# Patient Record
Sex: Male | Born: 1985 | Race: Black or African American | Hispanic: No | Marital: Single | Smoking: Light tobacco smoker
Health system: Southern US, Community
[De-identification: ages and names within clinical notes are randomized; demographics above are authoritative.]

## PROBLEM LIST (undated history)

## (undated) DIAGNOSIS — C801 Malignant (primary) neoplasm, unspecified: Secondary | ICD-10-CM

## (undated) DIAGNOSIS — T7840XA Allergy, unspecified, initial encounter: Secondary | ICD-10-CM

## (undated) DIAGNOSIS — J45909 Unspecified asthma, uncomplicated: Secondary | ICD-10-CM

## (undated) HISTORY — DX: Allergy, unspecified, initial encounter: T78.40XA

---
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
## 2013-09-03 ENCOUNTER — Encounter (HOSPITAL_COMMUNITY): Payer: Self-pay | Admitting: Emergency Medicine

## 2013-09-03 ENCOUNTER — Emergency Department (INDEPENDENT_AMBULATORY_CARE_PROVIDER_SITE_OTHER)
Admission: EM | Admit: 2013-09-03 | Discharge: 2013-09-03 | Disposition: A | Discharge status: Home / Self Care | Payer: Self-pay | Source: Home / Self Care | Attending: Family Medicine | Admitting: Family Medicine

## 2013-09-03 DIAGNOSIS — J309 Allergic rhinitis, unspecified: Secondary | ICD-10-CM

## 2013-09-03 DIAGNOSIS — J4520 Mild intermittent asthma, uncomplicated: Secondary | ICD-10-CM

## 2013-09-03 DIAGNOSIS — J45909 Unspecified asthma, uncomplicated: Secondary | ICD-10-CM

## 2013-09-03 HISTORY — DX: Unspecified asthma, uncomplicated: J45.909

## 2013-09-03 MED ORDER — LORATADINE 10 MG PO TABS: 10.0000 mg | ORAL_TABLET | Freq: Every day | ORAL | Status: DC

## 2013-09-03 MED ORDER — ALBUTEROL SULFATE HFA 108 (90 BASE) MCG/ACT IN AERS: 1.0000 | INHALATION_SPRAY | Freq: Four times a day (QID) | RESPIRATORY_TRACT | Status: DC | PRN

## 2013-09-03 NOTE — ED Provider Notes (Signed)
Medical screening examination/treatment/procedure(s) were performed by resident physician or non-physician practitioner and as supervising physician I was immediately available for consultation/collaboration.   Pauline Good MD.   Billy Fischer, MD 09/03/13 2029

## 2013-09-03 NOTE — Discharge Instructions (Signed)
Allergic Rhinitis Allergic rhinitis is when the mucous membranes in the nose respond to allergens. Allergens are particles in the air that cause your body to have an allergic reaction. This causes you to release allergic antibodies. Through a chain of events, these eventually cause you to release histamine into the blood stream. Although meant to protect the body, it is this release of histamine that causes your discomfort, such as frequent sneezing, congestion, and an itchy, runny nose.  CAUSES  Seasonal allergic rhinitis (hay fever) is caused by pollen allergens that may come from grasses, trees, and weeds. Year-round allergic rhinitis (perennial allergic rhinitis) is caused by allergens such as house dust mites, pet dander, and mold spores.  SYMPTOMS   Nasal stuffiness (congestion).  Itchy, runny nose with sneezing and tearing of the eyes. DIAGNOSIS  Your health care provider can help you determine the allergen or allergens that trigger your symptoms. If you and your health care provider are unable to determine the allergen, skin or blood testing may be used. TREATMENT  Allergic rhinitis does not have a cure, but it can be controlled by:  Medicines and allergy shots (immunotherapy).  Avoiding the allergen. Hay fever may often be treated with antihistamines in pill or nasal spray forms. Antihistamines block the effects of histamine. There are over-the-counter medicines that may help with nasal congestion and swelling around the eyes. Check with your health care provider before taking or giving this medicine.  If avoiding the allergen or the medicine prescribed do not work, there are many new medicines your health care provider can prescribe. Stronger medicine may be used if initial measures are ineffective. Desensitizing injections can be used if medicine and avoidance does not work. Desensitization is when a patient is given ongoing shots until the body becomes less sensitive to the allergen.  Make sure you follow up with your health care provider if problems continue. HOME CARE INSTRUCTIONS It is not possible to completely avoid allergens, but you can reduce your symptoms by taking steps to limit your exposure to them. It helps to know exactly what you are allergic to so that you can avoid your specific triggers. SEEK MEDICAL CARE IF:   You have a fever.  You develop a cough that does not stop easily (persistent).  You have shortness of breath.  You start wheezing.  Symptoms interfere with normal daily activities. Document Released: 10/13/2000 Document Revised: 01/23/2013 Document Reviewed: 09/25/2012 [facility] Patient Information 2015 [place], [place]. This information is not intended to replace advice given to you by your health care provider. Make sure you discuss any questions you have with your health care provider.  Asthma Attack Prevention Although there is no way to prevent asthma from starting, you can take steps to control the disease and reduce its symptoms. Learn about your asthma and how to control it. Take an active role to control your asthma by working with your health care provider to create and follow an asthma action plan. An asthma action plan guides you in:  Taking your medicines properly.  Avoiding things that set off your asthma or make your asthma worse (asthma triggers).  Tracking your level of asthma control.  Responding to worsening asthma.  Seeking emergency care when needed. To track your asthma, keep records of your symptoms, check your peak flow number using a handheld device that shows how well air moves out of your lungs (peak flow meter), and get regular asthma checkups.  WHAT ARE SOME WAYS TO PREVENT AN ASTHMA ATTACK?  Take medicines as directed by your health care provider.  Keep track of your asthma symptoms and level of control.  With your health care provider, write a detailed plan for taking medicines and managing an asthma attack.  Then be sure to follow your action plan. Asthma is an ongoing condition that needs regular monitoring and treatment.  Identify and avoid asthma triggers. Many outdoor allergens and irritants (such as pollen, mold, cold air, and air pollution) can trigger asthma attacks. Find out what your asthma triggers are and take steps to avoid them.  Monitor your breathing. Learn to recognize warning signs of an attack, such as coughing, wheezing, or shortness of breath. Your lung function may decrease before you notice any signs or symptoms, so regularly measure and record your peak airflow with a home peak flow meter.  Identify and treat attacks early. If you act quickly, you are less likely to have a severe attack. You will also need less medicine to control your symptoms. When your peak flow measurements decrease and alert you to an upcoming attack, take your medicine as instructed and immediately stop any activity that may have triggered the attack. If your symptoms do not improve, get medical help.  Pay attention to increasing quick-relief inhaler use. If you find yourself relying on your quick-relief inhaler, your asthma is not under control. See your health care provider about adjusting your treatment. WHAT CAN MAKE MY SYMPTOMS WORSE? A number of common things can set off or make your asthma symptoms worse and cause temporary increased inflammation of your airways. Keep track of your asthma symptoms for several weeks, detailing all the environmental and emotional factors that are linked with your asthma. When you have an asthma attack, go back to your asthma diary to see which factor, or combination of factors, might have contributed to it. Once you know what these factors are, you can take steps to control many of them. If you have allergies and asthma, it is important to take asthma prevention steps at home. Minimizing contact with the substance to which you are allergic will help prevent an asthma attack.  Some triggers and ways to avoid these triggers are: Animal Dander:  Some people are allergic to the flakes of skin or dried saliva from animals with fur or feathers.   There is no such thing as a hypoallergenic dog or cat breed. All dogs or cats can cause allergies, even if they don't shed.  Keep these pets out of your home.  If you are not able to keep a pet outdoors, keep the pet out of your bedroom and other sleeping areas at all times, and keep the door closed.  Remove carpets and furniture covered with cloth from your home. If that is not possible, keep the pet away from fabric-covered furniture and carpets. Dust Mites: Many people with asthma are allergic to dust mites. Dust mites are tiny bugs that are found in every home in mattresses, pillows, carpets, fabric-covered furniture, bedcovers, clothes, stuffed toys, and other fabric-covered items.   Cover your mattress in a special dust-proof cover.  Cover your pillow in a special dust-proof cover, or wash the pillow each week in hot water. Water must be hotter than 130 F (54.4 C) to kill dust mites. Cold or warm water used with detergent and bleach can also be effective.  Wash the sheets and blankets on your bed each week in hot water.  Try not to sleep or lie on cloth-covered cushions.  Call ahead  when traveling and ask for a smoke-free hotel room. Bring your own bedding and pillows in case the hotel only supplies feather pillows and down comforters, which may contain dust mites and cause asthma symptoms.  Remove carpets from your bedroom and those laid on concrete, if you can.  Keep stuffed toys out of the bed, or wash the toys weekly in hot water or cooler water with detergent and bleach. Cockroaches: Many people with asthma are allergic to the droppings and remains of cockroaches.   Keep food and garbage in closed containers. Never leave food out.  Use poison baits, traps, powders, gels, or paste (for example, boric  acid).  If a spray is used to kill cockroaches, stay out of the room until the odor goes away. Indoor Mold:  Fix leaky faucets, pipes, or other sources of water that have mold around them.  Clean floors and moldy surfaces with a fungicide or diluted bleach.  Avoid using humidifiers, vaporizers, or swamp coolers. These can spread molds through the air. Pollen and Outdoor Mold:  When pollen or mold spore counts are high, try to keep your windows closed.  Stay indoors with windows closed from late morning to afternoon. Pollen and some mold spore counts are highest at that time.  Ask your health care provider whether you need to take anti-inflammatory medicine or increase your dose of the medicine before your allergy season starts. Other Irritants to Avoid:  Tobacco smoke is an irritant. If you smoke, ask your health care provider how you can quit. Ask family members to quit smoking, too. Do not allow smoking in your home or car.  If possible, do not use a wood-burning stove, kerosene heater, or fireplace. Minimize exposure to all sources of smoke, including incense, candles, fires, and fireworks.  Try to stay away from strong odors and sprays, such as perfume, talcum powder, hair spray, and paints.  Decrease humidity in your home and use an indoor air cleaning device. Reduce indoor humidity to below 60%. Dehumidifiers or central air conditioners can do this.  Decrease house dust exposure by changing furnace and air cooler filters frequently.  Try to have someone else vacuum for you once or twice a week. Stay out of rooms while they are being vacuumed and for a short while afterward.  If you vacuum, use a dust mask from a hardware store, a double-layered or microfilter vacuum cleaner bag, or a vacuum cleaner with a HEPA filter.  Sulfites in foods and beverages can be irritants. Do not drink beer or wine or eat dried fruit, processed potatoes, or shrimp if they cause asthma  symptoms.  Cold air can trigger an asthma attack. Cover your nose and mouth with a scarf on cold or windy days.  Several health conditions can make asthma more difficult to manage, including a runny nose, sinus infections, reflux disease, psychological stress, and sleep apnea. Work with your health care provider to manage these conditions.  Avoid close contact with people who have a respiratory infection such as a cold or the flu, since your asthma symptoms may get worse if you catch the infection. Wash your hands thoroughly after touching items that may have been handled by people with a respiratory infection.  Get a flu shot every year to protect against the flu virus, which often makes asthma worse for days or weeks. Also get a pneumonia shot if you have not previously had one. Unlike the flu shot, the pneumonia shot does not need to be given  yearly. Medicines:  Talk to your health care provider about whether it is safe for you to take aspirin or non-steroidal anti-inflammatory medicines (NSAIDs). In a small number of people with asthma, aspirin and NSAIDs can cause asthma attacks. These medicines must be avoided by people who have known aspirin-sensitive asthma. It is important that people with aspirin-sensitive asthma read labels of all over-the-counter medicines used to treat pain, colds, coughs, and fever.  Beta-blockers and ACE inhibitors are other medicines you should discuss with your health care provider. HOW CAN I FIND OUT WHAT I AM ALLERGIC TO? Ask your asthma health care provider about allergy skin testing or blood testing (the RAST test) to identify the allergens to which you are sensitive. If you are found to have allergies, the most important thing to do is to try to avoid exposure to any allergens that you are sensitive to as much as possible. Other treatments for allergies, such as medicines and allergy shots (immunotherapy) are available.  CAN I EXERCISE? Follow your health care  provider's advice regarding asthma treatment before exercising. It is important to maintain a regular exercise program, but vigorous exercise or exercise in cold, humid, or dry environments can cause asthma attacks, especially for those people who have exercise-induced asthma. Document Released: 01/06/2009 Document Revised: 01/23/2013 Document Reviewed: 07/26/2012 [facility] Patient Information 2015 [place], [place]. This information is not intended to replace advice given to you by your health care provider. Make sure you discuss any questions you have with your health care provider.  Asthma, Acute Bronchospasm Acute bronchospasm caused by asthma is also referred to as an asthma attack. Bronchospasm means your air passages become narrowed. The narrowing is caused by inflammation and tightening of the muscles in the air tubes (bronchi) in your lungs. This can make it hard to breathe or cause you to wheeze and cough. CAUSES Possible triggers are:  Animal dander from the skin, hair, or feathers of animals.  Dust mites contained in house dust.  Cockroaches.  Pollen from trees or grass.  Mold.  Cigarette or tobacco smoke.  Air pollutants such as dust, household cleaners, hair sprays, aerosol sprays, paint fumes, strong chemicals, or strong odors.  Cold air or weather changes. Cold air may trigger inflammation. Winds increase molds and pollens in the air.  Strong emotions such as crying or laughing hard.  Stress.  Certain medicines such as aspirin or beta-blockers.  Sulfites in foods and drinks, such as dried fruits and wine.  Infections or inflammatory conditions, such as a flu, cold, or inflammation of the nasal membranes (rhinitis).  Gastroesophageal reflux disease (GERD). GERD is a condition where stomach acid backs up into your esophagus.  Exercise or strenuous activity. SIGNS AND SYMPTOMS   Wheezing.  Excessive coughing, particularly at night.  Chest tightness.  Shortness  of breath. DIAGNOSIS  Your health care provider will ask you about your medical history and perform a physical exam. A chest X-ray or blood testing may be performed to look for other causes of your symptoms or other conditions that may have triggered your asthma attack. TREATMENT  Treatment is aimed at reducing inflammation and opening up the airways in your lungs. Most asthma attacks are treated with inhaled medicines. These include quick relief or rescue medicines (such as bronchodilators) and controller medicines (such as inhaled corticosteroids). These medicines are sometimes given through an inhaler or a nebulizer. Systemic steroid medicine taken by mouth or given through an IV tube also can be used to reduce the inflammation when  an attack is moderate or severe. Antibiotic medicines are only used if a bacterial infection is present.  HOME CARE INSTRUCTIONS   Rest.  Drink plenty of liquids. This helps the mucus to remain thin and be easily coughed up. Only use caffeine in moderation and do not use alcohol until you have recovered from your illness.  Do not smoke. Avoid being exposed to secondhand smoke.  You play a critical role in keeping yourself in good health. Avoid exposure to things that cause you to wheeze or to have breathing problems.  Keep your medicines up-to-date and available. Carefully follow your health care provider's treatment plan.  Take your medicine exactly as prescribed.  When pollen or pollution is bad, keep windows closed and use an air conditioner or go to places with air conditioning.  Asthma requires careful medical care. See your health care provider for a follow-up as advised. If you are more than [redacted] weeks pregnant and you were prescribed any new medicines, let your obstetrician know about the visit and how you are doing. Follow up with your health care provider as directed.  After you have recovered from your asthma attack, make an appointment with your  outpatient doctor to talk about ways to reduce the likelihood of future attacks. If you do not have a doctor who manages your asthma, make an appointment with a primary care doctor to discuss your asthma. SEEK IMMEDIATE MEDICAL CARE IF:   You are getting worse.  You have trouble breathing. If severe, call your local emergency services (911 in the [place]  You develop chest pain or discomfort.  You are vomiting.  You are not able to keep fluids down.  You are coughing up yellow, green, brown, or bloody sputum.  You have a fever and your symptoms suddenly get worse.  You have trouble swallowing. MAKE SURE YOU:   Understand these instructions.  Will watch your condition.  Will get help right away if you are not doing well or get worse. Document Released: 05/05/2006 Document Revised: 01/23/2013 Document Reviewed: 07/26/2012 [facility] Patient Information 2015 [place], [place]. This information is not intended to replace advice given to you by your health care provider. Make sure you discuss any questions you have with your health care provider.  Asthma Asthma is a recurring condition in which the airways tighten and narrow. Asthma can make it difficult to breathe. It can cause coughing, wheezing, and shortness of breath. Asthma episodes, also called asthma attacks, range from minor to life-threatening. Asthma cannot be cured, but medicines and lifestyle changes can help control it. CAUSES Asthma is believed to be caused by inherited (genetic) and environmental factors, but its exact cause is unknown. Asthma may be triggered by allergens, lung infections, or irritants in the air. Asthma triggers are different for each person. Common triggers include:   Animal dander.  Dust mites.  Cockroaches.  Pollen from trees or grass.  Mold.  Smoke.  Air pollutants such as dust, household cleaners, hair sprays, aerosol sprays, paint fumes, strong chemicals, or strong odors.  Cold air,  weather changes, and winds (which increase molds and pollens in the air).  Strong emotional expressions such as crying or laughing hard.  Stress.  Certain medicines (such as aspirin) or types of drugs (such as beta-blockers).  Sulfites in foods and drinks. Foods and drinks that may contain sulfites include dried fruit, potato chips, and sparkling grape juice.  Infections or inflammatory conditions such as the flu, a cold, or an inflammation of the  nasal membranes (rhinitis).  Gastroesophageal reflux disease (GERD).  Exercise or strenuous activity. SYMPTOMS Symptoms may occur immediately after asthma is triggered or many hours later. Symptoms include:  Wheezing.  Excessive nighttime or early morning coughing.  Frequent or severe coughing with a common cold.  Chest tightness.  Shortness of breath. DIAGNOSIS  The diagnosis of asthma is made by a review of your medical history and a physical exam. Tests may also be performed. These may include:  Lung function studies. These tests show how much air you breathe in and out.  Allergy tests.  Imaging tests such as X-rays. TREATMENT  Asthma cannot be cured, but it can usually be controlled. Treatment involves identifying and avoiding your asthma triggers. It also involves medicines. There are 2 classes of medicine used for asthma treatment:   Controller medicines. These prevent asthma symptoms from occurring. They are usually taken every day.  Reliever or rescue medicines. These quickly relieve asthma symptoms. They are used as needed and provide short-term relief. Your health care provider will help you create an asthma action plan. An asthma action plan is a written plan for managing and treating your asthma attacks. It includes a list of your asthma triggers and how they may be avoided. It also includes information on when medicines should be taken and when their dosage should be changed. An action plan may also involve the use of a  device called a peak flow meter. A peak flow meter measures how well the lungs are working. It helps you monitor your condition. HOME CARE INSTRUCTIONS   Take medicines only as directed by your health care provider. Speak with your health care provider if you have questions about how or when to take the medicines.  Use a peak flow meter as directed by your health care provider. Record and keep track of readings.  Understand and use the action plan to help minimize or stop an asthma attack without needing to seek medical care.  Control your home environment in the following ways to help prevent asthma attacks:  Do not smoke. Avoid being exposed to secondhand smoke.  Change your heating and air conditioning filter regularly.  Limit your use of fireplaces and wood stoves.  Get rid of pests (such as roaches and mice) and their droppings.  Throw away plants if you see mold on them.  Clean your floors and dust regularly. Use unscented cleaning products.  Try to have someone else vacuum for you regularly. Stay out of rooms while they are being vacuumed and for a short while afterward. If you vacuum, use a dust mask from a hardware store, a double-layered or microfilter vacuum cleaner bag, or a vacuum cleaner with a HEPA filter.  Replace carpet with wood, tile, or vinyl flooring. Carpet can trap dander and dust.  Use allergy-proof pillows, mattress covers, and box spring covers.  Wash bed sheets and blankets every week in hot water and dry them in a dryer.  Use blankets that are made of polyester or cotton.  Clean bathrooms and kitchens with bleach. If possible, have someone repaint the walls in these rooms with mold-resistant paint. Keep out of the rooms that are being cleaned and painted.  Wash hands frequently. SEEK MEDICAL CARE IF:   You have wheezing, shortness of breath, or a cough even if taking medicine to prevent attacks.  The colored mucus you cough up (sputum) is thicker  than usual.  Your sputum changes from clear or white to yellow, green, gray, or  bloody.  You have any problems that may be related to the medicines you are taking (such as a rash, itching, swelling, or trouble breathing).  You are using a reliever medicine more than 2-3 times per week.  Your peak flow is still at 50-79% of your personal best after following your action plan for 1 hour.  You have a fever. SEEK IMMEDIATE MEDICAL CARE IF:   You seem to be getting worse and are unresponsive to treatment during an asthma attack.  You are short of breath even at rest.  You get short of breath when doing very little physical activity.  You have difficulty eating, drinking, or talking due to asthma symptoms.  You develop chest pain.  You develop a fast heartbeat.  You have a bluish color to your lips or fingernails.  You are light-headed, dizzy, or faint.  Your peak flow is less than 50% of your personal best. MAKE SURE YOU:   Understand these instructions.  Will watch your condition.  Will get help right away if you are not doing well or get worse. Document Released: 01/18/2005 Document Revised: 06/04/2013 Document Reviewed: 08/17/2012 [facility] Patient Information 2015 [place], [place]. This information is not intended to replace advice given to you by your health care provider. Make sure you discuss any questions you have with your health care provider.

## 2013-09-03 NOTE — ED Provider Notes (Signed)
CSN: [id number]     Arrival date & time 09/03/13  1757 History   First MD Initiated Contact with Patient 09/03/13 1827     Chief Complaint  Patient presents with  . Asthma   (Consider location/radiation/quality/duration/timing/severity/associated sxs/prior Treatment) HPI Comments: Recently released from prison (on August 10, 2013) and is in transitional housing at [facility]. Quit smoking several months ago Long standing history of allergic rhinitis and asthma, Ran out of his albuterol inhaler and borrowed inhaler from another person today. States he is feeling better, but requests Rx for new albuterol MDI.   Patient is a 28 y.o. male presenting with asthma. The history is provided by the patient.  Asthma This is a recurrent problem. Episode onset: today while working at [employer].    Past Medical History  Diagnosis Date  . Asthma    History reviewed. No pertinent past surgical history. History reviewed. No pertinent family history. History  Substance Use Topics  . Smoking status: Never Smoker   . Smokeless tobacco: Not on file  . Alcohol Use: Not on file    Review of Systems  All other systems reviewed and are negative.   Allergies  Review of patient's allergies indicates no known allergies.  Home Medications   Prior to Admission medications   Medication Sig Start Date End Date Taking? Authorizing Provider  albuterol (PROVENTIL HFA;VENTOLIN HFA) 108 (90 BASE) MCG/ACT inhaler Inhale 1-2 puffs into the lungs every 6 (six) hours as needed for wheezing or shortness of breath. 09/03/13   Lahoma Rocker, PA  loratadine (CLARITIN) 10 MG tablet Take 1 tablet (10 mg total) by mouth daily. 09/03/13   Annett Gula Jahon Bart, PA   BP 119/72  Pulse 78  Temp(Src) 98 F (36.7 C) (Oral)  Resp 18  SpO2 97% Physical Exam  Nursing note and vitals reviewed. Constitutional: He is oriented to person, place, and time. He appears well-developed and well-nourished. No distress.   No hypoxia  HENT:  Head: Normocephalic and atraumatic.  Right Ear: External ear normal.  Left Ear: External ear normal.  Nose: Nose normal.  Mouth/Throat: Oropharynx is clear and moist.  Eyes: Conjunctivae are normal. No scleral icterus.  Cardiovascular: Normal rate, regular rhythm and normal heart sounds.   Pulmonary/Chest: Effort normal and breath sounds normal. No stridor. No respiratory distress. He has no wheezes. He exhibits no tenderness.  Musculoskeletal: Normal range of motion.  Neurological: He is alert and oriented to person, place, and time.  Skin: Skin is warm and dry. No rash noted. No erythema.  Psychiatric: He has a normal mood and affect. His behavior is normal.    ED Course  Procedures (including critical care time) Labs Review Labs Reviewed - No data to display  Imaging Review No results found.   MDM   1. Allergic rhinitis, unspecified allergic rhinitis type   2. Asthma, mild intermittent, uncomplicated    Albuterol MDI and Claritin as prescribed. Voices understanding that if symptoms become suddenly worse or severe, he is to report to his nearest ER for assistance.    [place], [place] 09/03/13 [phone]

## 2013-09-03 NOTE — ED Notes (Signed)
States he has a prior history of breathing problems that are worse when he is around dogs. 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 C/o he had to borrow albuterol MDI from another person because of his breathing problems

## 2013-09-14 ENCOUNTER — Emergency Department (INDEPENDENT_AMBULATORY_CARE_PROVIDER_SITE_OTHER)
Admission: EM | Admit: 2013-09-14 | Discharge: 2013-09-14 | Disposition: A | Discharge status: Nursing Home (Includes ICF) | Payer: Self-pay | Source: Home / Self Care | Attending: Family Medicine | Admitting: Family Medicine

## 2013-09-14 ENCOUNTER — Encounter (HOSPITAL_COMMUNITY): Payer: Self-pay | Admitting: Emergency Medicine

## 2013-09-14 ENCOUNTER — Emergency Department (HOSPITAL_COMMUNITY)
Admission: EM | Admit: 2013-09-14 | Discharge: 2013-09-14 | Disposition: A | Discharge status: Home / Self Care | Payer: Self-pay | Attending: Emergency Medicine | Admitting: Emergency Medicine

## 2013-09-14 ENCOUNTER — Emergency Department (HOSPITAL_COMMUNITY): Payer: Self-pay

## 2013-09-14 DIAGNOSIS — R509 Fever, unspecified: Secondary | ICD-10-CM

## 2013-09-14 DIAGNOSIS — K219 Gastro-esophageal reflux disease without esophagitis: Secondary | ICD-10-CM | POA: Insufficient documentation

## 2013-09-14 DIAGNOSIS — Z87891 Personal history of nicotine dependence: Secondary | ICD-10-CM | POA: Insufficient documentation

## 2013-09-14 DIAGNOSIS — R110 Nausea: Secondary | ICD-10-CM | POA: Insufficient documentation

## 2013-09-14 DIAGNOSIS — IMO0001 Reserved for inherently not codable concepts without codable children: Secondary | ICD-10-CM | POA: Insufficient documentation

## 2013-09-14 DIAGNOSIS — Z79899 Other long term (current) drug therapy: Secondary | ICD-10-CM | POA: Insufficient documentation

## 2013-09-14 DIAGNOSIS — R51 Headache: Secondary | ICD-10-CM | POA: Insufficient documentation

## 2013-09-14 DIAGNOSIS — R489 Unspecified symbolic dysfunctions: Secondary | ICD-10-CM | POA: Insufficient documentation

## 2013-09-14 DIAGNOSIS — Z9889 Other specified postprocedural states: Secondary | ICD-10-CM | POA: Insufficient documentation

## 2013-09-14 DIAGNOSIS — R1031 Right lower quadrant pain: Secondary | ICD-10-CM

## 2013-09-14 DIAGNOSIS — Z8509 Personal history of malignant neoplasm of other digestive organs: Secondary | ICD-10-CM | POA: Insufficient documentation

## 2013-09-14 DIAGNOSIS — R109 Unspecified abdominal pain: Secondary | ICD-10-CM | POA: Insufficient documentation

## 2013-09-14 DIAGNOSIS — Z880 Allergy status to penicillin: Secondary | ICD-10-CM | POA: Insufficient documentation

## 2013-09-14 DIAGNOSIS — J45909 Unspecified asthma, uncomplicated: Secondary | ICD-10-CM | POA: Insufficient documentation

## 2013-09-14 DIAGNOSIS — R630 Anorexia: Secondary | ICD-10-CM | POA: Insufficient documentation

## 2013-09-14 DIAGNOSIS — R197 Diarrhea, unspecified: Secondary | ICD-10-CM | POA: Insufficient documentation

## 2013-09-14 HISTORY — DX: Malignant (primary) neoplasm, unspecified: C80.1

## 2013-09-14 LAB — COMPREHENSIVE METABOLIC PANEL
ALBUMIN: 4 g/dL (ref 3.5–5.2)
ALT: 11 U/L (ref 0–53)
AST: 21 U/L (ref 0–37)
Alkaline Phosphatase: 126 U/L — ABNORMAL HIGH (ref 39–117)
Anion gap: 12 (ref 5–15)
BUN: 13 mg/dL (ref 6–23)
CO2: 28 meq/L (ref 19–32)
CREATININE: 1.45 mg/dL — AB (ref 0.50–1.35)
Calcium: 9.9 mg/dL (ref 8.4–10.5)
Chloride: 100 mEq/L (ref 96–112)
GFR calc Af Amer: 74 mL/min — ABNORMAL LOW (ref >90)
GFR calc non Af Amer: 64 mL/min — ABNORMAL LOW (ref >90)
Glucose, Bld: 89 mg/dL (ref 70–99)
Potassium: 5 mEq/L (ref 3.7–5.3)
SODIUM: 140 meq/L (ref 137–147)
TOTAL PROTEIN: 7.7 g/dL (ref 6.0–8.3)
Total Bilirubin: 0.4 mg/dL (ref 0.3–1.2)

## 2013-09-14 LAB — CBC WITH DIFFERENTIAL/PLATELET
BASOS ABS: 0 10*3/uL (ref 0.0–0.1)
BASOS PCT: 0 % (ref 0–1)
EOS ABS: 0.2 10*3/uL (ref 0.0–0.7)
EOS PCT: 1 % (ref 0–5)
HCT: 44.2 % (ref 39.0–52.0)
Hemoglobin: 14.3 g/dL (ref 13.0–17.0)
LYMPHS PCT: 15 % (ref 12–46)
Lymphs Abs: 1.7 10*3/uL (ref 0.7–4.0)
MCH: 28.1 pg (ref 26.0–34.0)
MCHC: 32.4 g/dL (ref 30.0–36.0)
MCV: 87 fL (ref 78.0–100.0)
Monocytes Absolute: 1.1 10*3/uL — ABNORMAL HIGH (ref 0.1–1.0)
Monocytes Relative: 10 % (ref 3–12)
Neutro Abs: 8.1 10*3/uL — ABNORMAL HIGH (ref 1.7–7.7)
Neutrophils Relative %: 74 % (ref 43–77)
Platelets: 317 10*3/uL (ref 150–400)
RBC: 5.08 MIL/uL (ref 4.22–5.81)
RDW: 12.2 % (ref 11.5–15.5)
WBC: 11.1 10*3/uL — ABNORMAL HIGH (ref 4.0–10.5)

## 2013-09-14 LAB — URINALYSIS, ROUTINE W REFLEX MICROSCOPIC
Bilirubin Urine: NEGATIVE
GLUCOSE, UA: NEGATIVE mg/dL
Ketones, ur: 15 mg/dL — AB
LEUKOCYTES UA: NEGATIVE
Nitrite: NEGATIVE
PH: 6 (ref 5.0–8.0)
Protein, ur: NEGATIVE mg/dL
SPECIFIC GRAVITY, URINE: 1.015 (ref 1.005–1.030)
Urobilinogen, UA: 0.2 mg/dL (ref 0.0–1.0)

## 2013-09-14 LAB — URINE MICROSCOPIC-ADD ON

## 2013-09-14 LAB — LIPASE, BLOOD: Lipase: 17 U/L (ref 11–59)

## 2013-09-14 MED ORDER — IOHEXOL 300 MG/ML  SOLN
25.0000 mL | Freq: Once | INTRAMUSCULAR | Status: AC | PRN
  Administered 2013-09-14: 25 mL via ORAL

## 2013-09-14 MED ORDER — ONDANSETRON HCL 4 MG/2ML IJ SOLN
4.0000 mg | Freq: Once | INTRAMUSCULAR | Status: AC
  Administered 2013-09-14: 4 mg via INTRAVENOUS
  Filled 2013-09-14: qty 2

## 2013-09-14 MED ORDER — SODIUM CHLORIDE 0.9 % IV BOLUS (SEPSIS)
1000.0000 mL | Freq: Once | INTRAVENOUS | Status: AC
  Administered 2013-09-14: 1000 mL via INTRAVENOUS

## 2013-09-14 MED ORDER — IOHEXOL 300 MG/ML  SOLN
100.0000 mL | Freq: Once | INTRAMUSCULAR | Status: AC | PRN
  Administered 2013-09-14: 100 mL via INTRAVENOUS

## 2013-09-14 MED ORDER — ONDANSETRON 4 MG PO TBDP: 4.0000 mg | ORAL_TABLET | Freq: Three times a day (TID) | ORAL | Status: DC | PRN

## 2013-09-14 NOTE — ED Notes (Signed)
Back from CT, no changes, alert, interactive, calm, NAD, using urinal.

## 2013-09-14 NOTE — Discharge Instructions (Signed)

## 2013-09-14 NOTE — ED Provider Notes (Signed)
John Allen is a 28 y.o. male who presents to Urgent Care today for abdominal pain. Patient notes a two-day history of abdominal pain diarrhea and fever. He notes some nausea and heaving. He denies any chest pains or palpitations. He works at the [employer]. Additionally he notes hand and feet pain. He notes some bright red blood when he wipes. He denies any urinary symptoms   Past Medical History  Diagnosis Date  . Asthma    History  Substance Use Topics  . Smoking status: Never Smoker   . Smokeless tobacco: Not on file  . Alcohol Use: Not on file   ROS as above Medications: No current facility-administered medications for this encounter.   Current Outpatient Prescriptions  Medication Sig Dispense Refill  . albuterol (PROVENTIL HFA;VENTOLIN HFA) 108 (90 BASE) MCG/ACT inhaler Inhale 1-2 puffs into the lungs every 6 (six) hours as needed for wheezing or shortness of breath.  1 Inhaler  1  . loratadine (CLARITIN) 10 MG tablet Take 1 tablet (10 mg total) by mouth daily.  30 tablet  2    Exam:  BP 123/81  Pulse 94  Temp(Src) 100.9 F (38.3 C) (Oral)  Resp 14  SpO2 98% Gen: Well NAD HEENT: EOMI,  MMM Lungs: Normal work of breathing. CTABL Heart: RRR no MRG Abd: NABS, . Nondistended, tender palpation right lower corner with mild rebounding and guarding Exts: Brisk capillary refill, warm and well perfused.   No results found for this or any previous visit (from the past 24 hour(s)). No results found.  Assessment and Plan: 28 y.o. male with right lower corner and abdominal pain associated with fever and possible bloody diarrhea. Concerning for appendicitis or colitis. Plan to transfer to the emergency department via shuttle for further evaluation and management.  Discussed warning signs or symptoms. Please see discharge instructions. Patient expresses understanding.   This note was created using Systems analyst. Any transcription errors are unintended.     Gregor Hams, MD 09/14/13 [phone]

## 2013-09-14 NOTE — ED Notes (Signed)
Pt to CT

## 2013-09-14 NOTE — ED Notes (Signed)
Pt presents with RLQ pain, fever, all over body aches, diarrhea x2 days. Pt also report pain to BUE and BLE x2 month. Pt is a resident at [employer] and works at the [employer].

## 2013-09-14 NOTE — ED Notes (Signed)
Brought pt back to room via wheelchair; pt undressed, in gown, on continuous pulse oximetry and blood pressure cuff; Hassan Rowan, RN aware of pt

## 2013-09-14 NOTE — ED Provider Notes (Signed)
CSN: [id number]     Arrival date & time 09/14/13  1735 History   First MD Initiated Contact with Patient 09/14/13 1849     Chief Complaint  Patient presents with  . Abdominal Pain  . Diarrhea  . Fever  . Headache     (Consider location/radiation/quality/duration/timing/severity/associated sxs/prior Treatment) The history is provided by the patient.   patient has had some nausea and diarrhea for 2 days. He states he is abdominal pain. Is worse on the right side. CTs have fevers. He states his hands and feet also hurt. No chest pain. He states he has myalgias. The pain is dull and somewhat constant. It's on his right side.  Past Medical History  Diagnosis Date  . Asthma   . Cancer     abd    Past Surgical History  Procedure Laterality Date  . Abdominal surgery      remove tumor  . Cystectomy      Right Hip   History reviewed. No pertinent family history. History  Substance Use Topics  . Smoking status: Former Research scientist (life sciences)  . Smokeless tobacco: Not on file  . Alcohol Use: No    Review of Systems  Constitutional: Positive for appetite change and fatigue. Negative for activity change.  Eyes: Negative for pain.  Respiratory: Negative for chest tightness and shortness of breath.   Cardiovascular: Negative for chest pain and leg swelling.  Gastrointestinal: Positive for nausea, abdominal pain and diarrhea. Negative for vomiting.  Genitourinary: Negative for flank pain.  Musculoskeletal: Positive for myalgias. Negative for back pain and neck stiffness.  Skin: Negative for rash.  Neurological: Negative for weakness, numbness and headaches.  Psychiatric/Behavioral: Negative for behavioral problems.      Allergies  Penicillins and Strawberry  Home Medications   Prior to Admission medications   Medication Sig Start Date End Date Taking? Authorizing Provider  albuterol (PROVENTIL HFA;VENTOLIN HFA) 108 (90 BASE) MCG/ACT inhaler Inhale 1-2 puffs into the lungs every 6 (six) hours  as needed for wheezing or shortness of breath. 09/03/13  Yes Audelia Hives Presson, PA  loratadine (CLARITIN) 10 MG tablet Take 1 tablet (10 mg total) by mouth daily. 09/03/13  Yes Audelia Hives Presson, PA  ondansetron (ZOFRAN ODT) 4 MG disintegrating tablet Take 1 tablet (4 mg total) by mouth every 8 (eight) hours as needed for nausea or vomiting. 09/14/13   Sherian Maroon, MD   BP 122/73  Pulse 79  Temp(Src) 99.1 F (37.3 C) (Oral)  Resp 18  SpO2 98% Physical Exam  Nursing note and vitals reviewed. Constitutional: He is oriented to person, place, and time. He appears well-developed and well-nourished.  HENT:  Head: Normocephalic and atraumatic.  Eyes: EOM are normal. Pupils are equal, round, and reactive to light.  Neck: Normal range of motion. Neck supple.  Cardiovascular: Normal rate, regular rhythm and normal heart sounds.   No murmur heard. Pulmonary/Chest: Effort normal and breath sounds normal.  Abdominal: Soft. Bowel sounds are normal. He exhibits no distension and no mass. There is tenderness. There is no rebound and no guarding.  Right mid to lower abdominal tenderness. No rebound or guarding. No hernias palpated  Musculoskeletal: Normal range of motion. He exhibits no edema.  Neurological: He is alert and oriented to person, place, and time. No cranial nerve deficit.  Skin: Skin is warm and dry.  Psychiatric: He has a normal mood and affect.    ED Course  Procedures (including critical care time) Labs Review Labs Reviewed  CBC WITH DIFFERENTIAL - Abnormal; Notable for the following:    WBC 11.1 (*)    Neutro Abs 8.1 (*)    Monocytes Absolute 1.1 (*)    All other components within normal limits  COMPREHENSIVE METABOLIC PANEL - Abnormal; Notable for the following:    Creatinine, Ser 1.45 (*)    Alkaline Phosphatase 126 (*)    GFR calc non Af Amer 64 (*)    GFR calc Af Amer 74 (*)    All other components within normal limits  URINALYSIS, ROUTINE W REFLEX MICROSCOPIC -  Abnormal; Notable for the following:    Hgb urine dipstick MODERATE (*)    Ketones, ur 15 (*)    All other components within normal limits  LIPASE, BLOOD  URINE MICROSCOPIC-ADD ON    Imaging Review Ct Abdomen Pelvis W Contrast  09/14/2013   CLINICAL DATA:  Right lower quadrant abdominal pain and nausea  EXAM: CT ABDOMEN AND PELVIS WITH CONTRAST  TECHNIQUE: Multidetector CT imaging of the abdomen and pelvis was performed using the standard protocol following bolus administration of intravenous contrast.  CONTRAST:  174mL OMNIPAQUE IOHEXOL 300 MG/ML  SOLN  COMPARISON:  None.  FINDINGS: Lung bases are clear.  No pericardial fluid.  No focal hepatic lesion. The gallbladder, pancreas, spleen, adrenal glands, and kidneys are normal. There is a low-density lesion in the right kidney measuring 7 mm. This likely represents a small benign cyst but is too small to completely characterize.  The stomach, small bowel, cecum, and appendix are normal. Terminal ileum is normal. The colon and rectosigmoid colon are normal.  Abdominal aorta normal caliber. No retroperitoneal periportal lymphadenopathy.  There is small amount free fluid in the right lower pelvis. The prostate and seminal vesicles appear normal. Normal bladder.  No pelvic lymphadenopathy. No inguinal hernia. No aggressive osseous lesion.  IMPRESSION: 1. Normal appendix. 2. Small amount free fluid in the right pelvis of unclear etiology. 3. No ureterolithiasis or obstructive uropathy. 4. Small hypodense lesions in right kidney is likely a benign cyst. 5. Gallbladder appears normal.   Electronically Signed   By: Suzy Bouchard M.D.   On: 09/14/2013 20:32     EKG Interpretation None      MDM   Final diagnoses:  Diarrhea    Patient with abdominal pain. Likely infectious diarrhea. Likely cause of free fluid in pelvis. Lab work overall reassuring. Will discharge    Jasper Riling. Alvino Chapel, [place] 09/15/13 [phone]

## 2013-09-14 NOTE — ED Notes (Signed)
C/o abd cramping (dizziness and nausea resolved; denies: HA, sob, nausea, dizziness), alert, NAD, calm, interactive, no dyspnea noted.

## 2013-09-14 NOTE — ED Notes (Signed)
Pt   Reports       He  Has  Body  Aches   And  Fever           For several  Days  He  Reports   he  Works  At the  [employer]  And is  A  Resident        Of the  [employer]        And he  Reports    abd  Pain  As  Well

## 2013-09-14 NOTE — ED Notes (Signed)
CT notified, pt ready.

## 2013-09-14 NOTE — ED Notes (Signed)
Pt up to br

## 2013-10-12 ENCOUNTER — Encounter (HOSPITAL_COMMUNITY): Payer: Self-pay | Admitting: Emergency Medicine

## 2013-10-12 ENCOUNTER — Emergency Department (HOSPITAL_COMMUNITY)
Admission: EM | Admit: 2013-10-12 | Discharge: 2013-10-12 | Disposition: A | Discharge status: Home / Self Care | Payer: Self-pay | Attending: Emergency Medicine | Admitting: Emergency Medicine

## 2013-10-12 DIAGNOSIS — Z880 Allergy status to penicillin: Secondary | ICD-10-CM | POA: Insufficient documentation

## 2013-10-12 DIAGNOSIS — M79609 Pain in unspecified limb: Secondary | ICD-10-CM | POA: Insufficient documentation

## 2013-10-12 DIAGNOSIS — Z79899 Other long term (current) drug therapy: Secondary | ICD-10-CM | POA: Insufficient documentation

## 2013-10-12 DIAGNOSIS — R202 Paresthesia of skin: Secondary | ICD-10-CM

## 2013-10-12 DIAGNOSIS — J45909 Unspecified asthma, uncomplicated: Secondary | ICD-10-CM | POA: Insufficient documentation

## 2013-10-12 DIAGNOSIS — Z87891 Personal history of nicotine dependence: Secondary | ICD-10-CM | POA: Insufficient documentation

## 2013-10-12 DIAGNOSIS — Z8589 Personal history of malignant neoplasm of other organs and systems: Secondary | ICD-10-CM | POA: Insufficient documentation

## 2013-10-12 DIAGNOSIS — R209 Unspecified disturbances of skin sensation: Secondary | ICD-10-CM | POA: Insufficient documentation

## 2013-10-12 LAB — URINALYSIS, ROUTINE W REFLEX MICROSCOPIC
BILIRUBIN URINE: NEGATIVE
Glucose, UA: NEGATIVE mg/dL
HGB URINE DIPSTICK: NEGATIVE
Ketones, ur: NEGATIVE mg/dL
Leukocytes, UA: NEGATIVE
Nitrite: NEGATIVE
Protein, ur: NEGATIVE mg/dL
Specific Gravity, Urine: 1.019 (ref 1.005–1.030)
UROBILINOGEN UA: 1 mg/dL (ref 0.0–1.0)
pH: 7 (ref 5.0–8.0)

## 2013-10-12 LAB — I-STAT CHEM 8, ED
BUN: 10 mg/dL (ref 6–23)
CALCIUM ION: 1.23 mmol/L (ref 1.12–1.23)
CHLORIDE: 103 meq/L (ref 96–112)
Creatinine, Ser: 1.3 mg/dL (ref 0.50–1.35)
GLUCOSE: 92 mg/dL (ref 70–99)
HCT: 41 % (ref 39.0–52.0)
Hemoglobin: 13.9 g/dL (ref 13.0–17.0)
Potassium: 3.9 mEq/L (ref 3.7–5.3)
Sodium: 140 mEq/L (ref 137–147)
TCO2: 28 mmol/L (ref 0–100)

## 2013-10-12 NOTE — ED Notes (Signed)
Pt c/o numbness in both his hands and his feet for 5 months..  He also has allergies

## 2013-10-12 NOTE — ED Provider Notes (Signed)
CSN: [id number]     Arrival date & time 10/12/13  1821 History   First MD Initiated Contact with Patient 10/12/13 2217     Chief Complaint  Patient presents with  . Hand Problem      HPI Patient reports for past 5 months he will wake up in the morning and have swelling in both hands with feeling of numbness and difficult "making a fist" due to the swelling. This will improve throughout the day.  No focal weakness.  No swelling above the wrist.  He also reports pain on plantar surface of his feet for several months, improves throughout the day.  No new neck or back pain.  No leg weakness.     Past Medical History  Diagnosis Date  . Asthma   . Cancer     abd    Past Surgical History  Procedure Laterality Date  . Abdominal surgery      remove tumor  . Cystectomy      Right Hip   No family history on file. History  Substance Use Topics  . Smoking status: Former Research scientist (life sciences)  . Smokeless tobacco: Not on file  . Alcohol Use: No    Review of Systems  Constitutional: Negative for fever.  Gastrointestinal: Negative for vomiting.  Genitourinary: Negative for dysuria.  Musculoskeletal: Negative for back pain and neck pain.  Neurological: Positive for numbness. Negative for weakness.      Allergies  Penicillins and Strawberry  Home Medications   Prior to Admission medications   Medication Sig Start Date End Date Taking? Authorizing Provider  albuterol (PROVENTIL HFA;VENTOLIN HFA) 108 (90 BASE) MCG/ACT inhaler Inhale 1-2 puffs into the lungs every 6 (six) hours as needed for wheezing or shortness of breath.   Yes Historical Provider, MD  loratadine (CLARITIN) 10 MG tablet Take 10 mg by mouth daily.   Yes Historical Provider, MD  ondansetron (ZOFRAN-ODT) 4 MG disintegrating tablet Take 4 mg by mouth every 8 (eight) hours as needed for nausea or vomiting.    Historical Provider, MD   BP 122/89  Pulse 57  Temp(Src) 98.3 F (36.8 C) (Oral)  Resp 15  Ht 5\' 7"  (1.702 m)  Wt 148 lb  (67.132 kg)  BMI 23.17 kg/m2  SpO2 99% Physical Exam CONSTITUTIONAL: Well developed/well nourished, watching TV, no distress HEAD: Normocephalic/atraumatic EYES: EOMI/PERRL ENMT: Mucous membranes moist NECK: supple no meningeal signs CV: S1/S2 noted, no murmurs/rubs/gallops noted LUNGS: Lungs are clear to auscultation bilaterally, no apparent distress ABDOMEN: soft, nontender, no rebound or guarding NEURO: Pt is awake/alert, moves all extremitiesx4 Equal power  with hand grip, wrist flex/extension, elbow flex/extension, and equal power with shoulder abduction/adduction.  No focal sensory deficit to light touch is noted in either UE.   Equal power with foot plantar/dorsi flexion.  He has equal power with knee flex/extension EXTREMITIES: pulses normal, full ROM.  Mild tenderness to plantar surface of both feet.  No erythema or evidence of skin breakdown noted.  No edema noted to upper or lower extremities SKIN: warm, color normal PSYCH: no abnormalities of mood noted  ED Course  Procedures   Pt with symptoms for months Suspect possible plantar fascitis for foot pain His neuro exam is unremarkable. He is well appearing and stable for d/c home I doubt acute neurologic emergency at this time  Crockett - Abnormal; Notable for the following:    APPearance HAZY (*)    All other components within  normal limits  I-STAT CHEM 8, ED      MDM   Final diagnoses:  Paresthesia    Nursing notes including past medical history and social history reviewed and considered in documentation. Labs/vital reviewed and considered     Sharyon Cable, MD 10/12/13 2302

## 2013-10-23 ENCOUNTER — Ambulatory Visit: Payer: Self-pay | Attending: Family Medicine | Admitting: Family Medicine

## 2013-10-23 ENCOUNTER — Encounter: Payer: Self-pay | Admitting: Family Medicine

## 2013-10-23 VITALS — BP 118/75 | HR 83 | Temp 97.8°F | Resp 16 | Ht 67.0 in | Wt 144.0 lb

## 2013-10-23 DIAGNOSIS — M79609 Pain in unspecified limb: Secondary | ICD-10-CM

## 2013-10-23 DIAGNOSIS — M79673 Pain in unspecified foot: Secondary | ICD-10-CM

## 2013-10-23 DIAGNOSIS — R209 Unspecified disturbances of skin sensation: Secondary | ICD-10-CM

## 2013-10-23 DIAGNOSIS — Z23 Encounter for immunization: Secondary | ICD-10-CM

## 2013-10-23 DIAGNOSIS — M79643 Pain in unspecified hand: Secondary | ICD-10-CM

## 2013-10-23 DIAGNOSIS — R200 Anesthesia of skin: Secondary | ICD-10-CM | POA: Insufficient documentation

## 2013-10-23 LAB — BASIC METABOLIC PANEL
BUN: 15 mg/dL (ref 6–23)
CHLORIDE: 103 meq/L (ref 96–112)
CO2: 30 mEq/L (ref 19–32)
Calcium: 10.3 mg/dL (ref 8.4–10.5)
Creat: 1.39 mg/dL — ABNORMAL HIGH (ref 0.50–1.35)
Glucose, Bld: 81 mg/dL (ref 70–99)
POTASSIUM: 5 meq/L (ref 3.5–5.3)
Sodium: 143 mEq/L (ref 135–145)

## 2013-10-23 LAB — RHEUMATOID FACTOR: Rhuematoid fact SerPl-aCnc: <10 IU/mL (ref <=14)

## 2013-10-23 MED ORDER — NAPROXEN 500 MG PO TABS: 500.0000 mg | ORAL_TABLET | Freq: Two times a day (BID) | ORAL | Status: DC

## 2013-10-23 NOTE — Progress Notes (Signed)
   Subjective:    Patient ID: John Allen, male    DOB: [birth year], 28 y.o.   MRN: [id number] CC: pain and tingling in hands and feet  HPI 28 yo M presents to establish care discussed the following:  1. Tingling and pain in hands and feet: x 4 months. Pain is worse in the morning. Symptoms are unchanged since onset. Slight joint swelling. No rash, fever, weight loss, trauma. Patient is R handed. Patient has tried ibuprofen with partial and temporary relief of symptoms.             Soc hx: current light smoker and tobacco chewer  Review of Systems As per HPI     Objective:   Physical Exam BP 118/75  Pulse 83  Temp(Src) 97.8 F (36.6 C) (Oral)  Resp 16  Ht 5\' 7"  (1.702 m)  Wt 144 lb (65.318 kg)  BMI 22.55 kg/m2  SpO2 99% General appearance: alert, cooperative and no distress Neck: no adenopathy, supple, symmetrical, trachea midline and thyroid not enlarged, symmetric, no tenderness/mass/nodules Extremities: extremities normal, atraumatic, no cyanosis or edema, full ROM of all joints in hands and feet  Skin: no rash      Assessment & Plan:

## 2013-10-23 NOTE — Progress Notes (Signed)
Establish Care Pt Complaining of pain in hands and feet, for the past seven month

## 2013-10-23 NOTE — Assessment & Plan Note (Signed)
A: Regarding pain and tingling in hands and feet. With normal exam today. Low suspicion for autoimmune disease.  P: We will get some blood work to rule out autoimmune arthritis, thyroid disease and B12 deficiency. Normal blood sugars, rule out diabetes   Take naproxen 500 mg twice daily with food for next two weeks.  You will be called with lab results  F/u in 4 weeks

## 2013-10-23 NOTE — Patient Instructions (Signed)
John Allen,  Thank you for coming in today. It was a pleasure meeting you. I look forward to being your primary doctor.   1. Regarding pain and tingling in hands and feet.  We will get some blood work to rule out autoimmune arthritis, thyroid disease and B12 deficiency. Normal blood sugars, rule out diabetes   Take naproxen 500 mg twice daily with food for next two weeks.  You will be called with lab results  F/u in 4 weeks  Dr. Adrian Blackwater

## 2013-10-24 LAB — VITAMIN B12: Vitamin B-12: 409 pg/mL (ref 211–911)

## 2013-10-24 LAB — SEDIMENTATION RATE: SED RATE: 1 mm/h (ref 0–16)

## 2013-10-24 LAB — TSH: TSH: 0.788 u[IU]/mL (ref 0.350–4.500)

## 2013-10-25 ENCOUNTER — Telehealth: Payer: Self-pay | Admitting: *Deleted

## 2013-10-25 NOTE — Telephone Encounter (Signed)
Left message with Herral, to return call

## 2013-10-25 NOTE — Telephone Encounter (Signed)
Message copied by Betti Cruz on Thu Oct 25, 2013 11:28 AM ------      Message from: Boykin Nearing      Created: Wed Oct 24, 2013  3:05 PM       Negative w/u for autoimmune arthritis, RF, sed rate.       Normal B12      Normal BMP except slightly elevated but stable Cr, continue to drink plenty of water ------

## 2013-12-06 ENCOUNTER — Ambulatory Visit: Payer: Self-pay | Admitting: Family Medicine

## 2013-12-08 ENCOUNTER — Emergency Department (HOSPITAL_COMMUNITY): Payer: Self-pay

## 2013-12-08 ENCOUNTER — Encounter (HOSPITAL_COMMUNITY): Payer: Self-pay | Admitting: Oncology

## 2013-12-08 ENCOUNTER — Emergency Department (HOSPITAL_COMMUNITY)
Admission: EM | Admit: 2013-12-08 | Discharge: 2013-12-08 | Disposition: A | Discharge status: Home / Self Care | Payer: Self-pay | Attending: Emergency Medicine | Admitting: Emergency Medicine

## 2013-12-08 DIAGNOSIS — Z791 Long term (current) use of non-steroidal anti-inflammatories (NSAID): Secondary | ICD-10-CM | POA: Insufficient documentation

## 2013-12-08 DIAGNOSIS — J45901 Unspecified asthma with (acute) exacerbation: Secondary | ICD-10-CM | POA: Insufficient documentation

## 2013-12-08 DIAGNOSIS — Z7952 Long term (current) use of systemic steroids: Secondary | ICD-10-CM | POA: Insufficient documentation

## 2013-12-08 DIAGNOSIS — Z8589 Personal history of malignant neoplasm of other organs and systems: Secondary | ICD-10-CM | POA: Insufficient documentation

## 2013-12-08 DIAGNOSIS — Z79899 Other long term (current) drug therapy: Secondary | ICD-10-CM | POA: Insufficient documentation

## 2013-12-08 DIAGNOSIS — Z880 Allergy status to penicillin: Secondary | ICD-10-CM | POA: Insufficient documentation

## 2013-12-08 DIAGNOSIS — Z720 Tobacco use: Secondary | ICD-10-CM | POA: Insufficient documentation

## 2013-12-08 MED ORDER — ALBUTEROL SULFATE HFA 108 (90 BASE) MCG/ACT IN AERS
2.0000 | INHALATION_SPRAY | RESPIRATORY_TRACT | Status: DC | PRN
  Administered 2013-12-08: 2 via RESPIRATORY_TRACT
  Filled 2013-12-08: qty 6.7

## 2013-12-08 MED ORDER — ALBUTEROL SULFATE HFA 108 (90 BASE) MCG/ACT IN AERS: 1.0000 | INHALATION_SPRAY | Freq: Four times a day (QID) | RESPIRATORY_TRACT | Status: DC | PRN

## 2013-12-08 MED ORDER — PREDNISONE 20 MG PO TABS
ORAL_TABLET | ORAL | Status: DC
Start: 2013-12-08 — End: 2013-12-14

## 2013-12-08 MED ORDER — PREDNISONE 20 MG PO TABS
60.0000 mg | ORAL_TABLET | Freq: Once | ORAL | Status: AC
  Administered 2013-12-08: 60 mg via ORAL
  Filled 2013-12-08: qty 3

## 2013-12-08 MED ORDER — IPRATROPIUM-ALBUTEROL 0.5-2.5 (3) MG/3ML IN SOLN
3.0000 mL | RESPIRATORY_TRACT | Status: DC
  Administered 2013-12-08: 3 mL via RESPIRATORY_TRACT
  Filled 2013-12-08: qty 3

## 2013-12-08 NOTE — ED Notes (Signed)
Pt works at an [employer], after cleaning he developed SOB.  Pt w/ hx of asthma however is out of his medication.  Pt used a friends inhaler after work that he believes was albuterol.  Pt is speaking in full sentences and is in NAD at this time.

## 2013-12-08 NOTE — ED Provider Notes (Signed)
CSN: [id number]     Arrival date & time 12/08/13  1904 History   First MD Initiated Contact with Patient 12/08/13 1931     Chief Complaint  Patient presents with  . 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 States he used his friend's albuterol inhaler with minimal relief. Chest pressure is "sore" intermittent, 3/10. Pt believes due to trying hard to breath.  States he normally has his own but has run out and currently staying at a new shelter so he does not have his nebulizer machine with him.  Denies fever, chills, n/v/d. Denies recent illness, sick contacts, or recent travel. No hx of blood clots or heart problems.  Past Medical History  Diagnosis Date  . Cancer     abd /at age 61  . Allergy 1996   . Asthma     since chidhood, has been hospitalized, never intubated    Past Surgical History  Procedure Laterality Date  . 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 Cystectomy  2004     Right Hip   Family History  Problem Relation Age of Onset  . Hypertension Mother   . Asthma Mother   . Cancer Maternal Aunt     breast cancer   . Heart disease Maternal Aunt   . Cancer Maternal Uncle     lung   . Heart disease Maternal Uncle    History  Substance Use Topics  . Smoking status: Light Tobacco Smoker    Types: Cigarettes  . Smokeless tobacco: Current User    Types: Chew  . Alcohol Use: No    Review of Systems  Constitutional: Negative for fever and chills.  HENT: Negative for congestion and sore throat.   Respiratory: Positive for cough, chest tightness and shortness of breath.   Cardiovascular: Positive for chest pain ( chest "soreness"). Negative for palpitations and leg swelling.   Gastrointestinal: Negative for nausea, vomiting, abdominal pain and diarrhea.  All other systems reviewed and are negative.     Allergies  Penicillins and Strawberry  Home Medications   Prior to Admission medications   Medication Sig Start Date End Date Taking? Authorizing Provider  albuterol (PROVENTIL HFA;VENTOLIN HFA) 108 (90 BASE) MCG/ACT inhaler Inhale 1-2 puffs into the lungs every 6 (six) hours as needed for wheezing or shortness of breath. 12/08/13   Noland Fordyce, PA-C  loratadine (CLARITIN) 10 MG tablet Take 10 mg by mouth daily.    Historical Provider, MD  naproxen (NAPROSYN) 500 MG tablet Take 1 tablet (500 mg total) by mouth 2 (two) times daily with a meal. 10/23/13   Josalyn C Funches, MD  predniSONE (DELTASONE) 20 MG tablet 3 tabs po day one, then 2 po daily x 4 days 12/08/13   Noland Fordyce, PA-C   BP 119/71 mmHg  Pulse 83  Temp(Src) 97.9 F (36.6 C) (Oral)  Resp 18  Ht 5\' 7"  (1.702 m)  Wt 147 lb (66.679 kg)  BMI 23.02 kg/m2  SpO2 98% Physical Exam  Constitutional: He appears well-developed and well-nourished.  HENT:  Head: Normocephalic and atraumatic.  Eyes: Conjunctivae are normal. No scleral icterus.  Neck: Normal range of motion.  Cardiovascular: Normal rate, regular rhythm and  normal heart sounds.   Pulmonary/Chest: Effort normal. No respiratory distress. He has wheezes. He has no rales. He exhibits no tenderness.  No respiratory distress. Faint expiratory wheeze with decreased lung sounds bilaterally in lower lung fields.   Abdominal: Soft. Bowel sounds are normal. He exhibits no distension and no mass. There is no tenderness. There is no rebound and no guarding.  Musculoskeletal: Normal range of motion.  Neurological: He is alert.  Skin: Skin is warm and dry.  Nursing note and vitals reviewed.   ED Course  Procedures (including critical care time) Labs Review Labs Reviewed - No data to display  Imaging Review Dg Chest 2 View (if Patient Has  Fever And/or Copd)  12/08/2013   CLINICAL DATA:  Pt has asthma and had an asthma attack at work today but was unable to get his inhaler for about 4-5 hrs later. SOB. Chest soreness. Non-smoker.  EXAM: CHEST  2 VIEW  COMPARISON:  None.  FINDINGS: The heart size and mediastinal contours are within normal limits. Both lungs are clear. No pleural effusion or pneumothorax. The visualized skeletal structures are unremarkable.  IMPRESSION: No active cardiopulmonary disease.   Electronically Signed   By: Lajean Manes M.D.   On: 12/08/2013 20:01     EKG Interpretation None      MDM   Final diagnoses:  Asthma exacerbation    Pt with hx of asthma presenting to ED with c/o SOB that started earlier today at work at an [employer]. Pt c/o centralized chest "soreness" due to difficulty breathing. Decreased bibasilar breath sounds with faint expiratory wheeze. No respiratory distress. O2- 96% on RA. No pleuritic chest pain. No tachycardia. Doubt pneumonia, CAD, or PE.   Pt given 60mg  PO prednisone and 1 duoneb tx.  Lung sounds improved after tx in ED. Pt states he feels comfortable being discharged home. Advised pt to f/u with PCP. Albuterol inhaler given in ED. Rx: prednisone and albuterol. Return precautions provided. Pt verbalized understanding and agreement with tx plan.     Noland Fordyce, PA-C 12/08/13 2028  Dorie Rank, MD 12/08/13 2032

## 2013-12-08 NOTE — Discharge Instructions (Signed)

## 2013-12-12 ENCOUNTER — Encounter (HOSPITAL_COMMUNITY): Payer: Self-pay

## 2013-12-12 ENCOUNTER — Emergency Department (INDEPENDENT_AMBULATORY_CARE_PROVIDER_SITE_OTHER)
Admission: EM | Admit: 2013-12-12 | Discharge: 2013-12-12 | Disposition: A | Discharge status: Home / Self Care | Payer: Self-pay | Source: Home / Self Care | Attending: Emergency Medicine | Admitting: Emergency Medicine

## 2013-12-12 DIAGNOSIS — B349 Viral infection, unspecified: Secondary | ICD-10-CM

## 2013-12-12 MED ORDER — ACETAMINOPHEN 325 MG PO TABS
ORAL_TABLET | ORAL | Status: AC
  Filled 2013-12-12: qty 2

## 2013-12-12 MED ORDER — ONDANSETRON 4 MG PO TBDP
ORAL_TABLET | ORAL | Status: AC
  Filled 2013-12-12: qty 1

## 2013-12-12 MED ORDER — ONDANSETRON 4 MG PO TBDP
4.0000 mg | ORAL_TABLET | Freq: Once | ORAL | Status: AC
  Administered 2013-12-12: 4 mg via ORAL

## 2013-12-12 MED ORDER — ACETAMINOPHEN 325 MG PO TABS
650.0000 mg | ORAL_TABLET | Freq: Once | ORAL | Status: AC
  Administered 2013-12-12: 650 mg via ORAL

## 2013-12-12 MED ORDER — ONDANSETRON HCL 4 MG PO TABS
4.0000 mg | ORAL_TABLET | Freq: Three times a day (TID) | ORAL | Status: AC | PRN
Start: 2013-12-12 — End: ?

## 2013-12-12 NOTE — ED Provider Notes (Signed)
CSN: [id number]     Arrival date & time 12/12/13  3299 History   First MD Initiated Contact with Patient 12/12/13 0957     Chief Complaint  Patient presents with  . Headache   (Consider location/radiation/quality/duration/timing/severity/associated sxs/prior Treatment) HPI Comments: Patient reports he woke overnight last night with nausea, vomiting, diarrhea and associated headache. States he took some ibuprofen and was able to eat pancakes and sausage without issue this morning for breakfast. Is here because he has residual headache and mild nausea. PCP: Volo Is a resident of the [facility] and works at [employer]  The history is provided by the patient.    Past Medical History  Diagnosis Date  . Cancer     abd /at age 47  . Allergy 1996   . Asthma     since chidhood, has been hospitalized, never intubated    Past Surgical History  Procedure Laterality Date  . 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 Cystectomy  2004     Right Hip   Family History  Problem Relation Age of Onset  . Hypertension Mother   . Asthma Mother   . Cancer Maternal Aunt     breast cancer   . Heart disease Maternal Aunt   . Cancer Maternal Uncle     lung   . Heart disease Maternal Uncle    History  Substance Use Topics  . Smoking status: Light Tobacco Smoker    Types: Cigarettes  . Smokeless tobacco: Current User    Types: Chew  . Alcohol Use: No    Review of Systems  Constitutional: Negative for fever and chills.  Eyes: Negative.   Respiratory: Negative.   Cardiovascular: Negative.   Gastrointestinal: Positive for nausea, vomiting and diarrhea.  Genitourinary: Negative.   Skin: Negative.   Neurological: Positive for headaches. Negative for dizziness, seizures, syncope, weakness, light-headedness and numbness.  All other systems reviewed and are negative.   Allergies  Penicillins and Strawberry  Home Medications    Prior to Admission medications   Medication Sig Start Date End Date Taking? Authorizing Provider  albuterol (PROVENTIL HFA;VENTOLIN HFA) 108 (90 BASE) MCG/ACT inhaler Inhale 1-2 puffs into the lungs every 6 (six) hours as needed for wheezing or shortness of breath. 12/08/13   Noland Fordyce, PA-C  loratadine (CLARITIN) 10 MG tablet Take 10 mg by mouth daily.    Historical Provider, MD  naproxen (NAPROSYN) 500 MG tablet Take 1 tablet (500 mg total) by mouth 2 (two) times daily with a meal. 10/23/13   Josalyn C Funches, MD  ondansetron (ZOFRAN) 4 MG tablet Take 1 tablet (4 mg total) by mouth every 8 (eight) hours as needed for nausea or vomiting. 12/12/13   Audelia Hives Addylin Manke, PA  predniSONE (DELTASONE) 20 MG tablet 3 tabs po day one, then 2 po daily x 4 days 12/08/13   Noland Fordyce, PA-C   BP 131/79 mmHg  Temp(Src) 98.3 F (36.8 C) (Oral)  Resp 12  SpO2 97% Physical Exam  Constitutional: He is oriented to person, place, and time. He appears well-developed and well-nourished. No distress.  HENT:  Head: Normocephalic and atraumatic.  Right Ear: Hearing and external ear normal.  Left Ear: Hearing and external ear normal.  Nose: Nose normal.  Mouth/Throat: Uvula is midline, oropharynx is clear and moist and mucous membranes are normal.  Eyes: Conjunctivae and EOM are normal. Pupils are equal, round,  and reactive to light. No scleral icterus.  Neck: Normal range of motion and full passive range of motion without pain. Neck supple.  Cardiovascular: Normal rate, regular rhythm and normal heart sounds.   Pulmonary/Chest: Effort normal and breath sounds normal.  Abdominal: Soft. Bowel sounds are normal. He exhibits no distension. There is no tenderness.  Musculoskeletal: Normal range of motion.  Lymphadenopathy:    He has no cervical adenopathy.  Neurological: He is alert and oriented to person, place, and time.  Skin: Skin is warm and dry. No rash noted. No erythema.  Psychiatric: He has a  normal mood and affect. His behavior is normal.  Nursing note and vitals reviewed.   ED Course  Procedures (including critical care time) Labs Review Labs Reviewed - No data to display  Imaging Review No results found.   MDM   1. Viral syndrome    Given oral zofran and tylenol while at [facility]. Reports some improvement. I suspect headache is simply related to viral gastroenteritis syndrome. Will advise adequate hydration at home, tylenol as directed on packaging for discomfort and Zofran as prescribed for nausea.    Lutricia Feil, [place] 12/12/13 1043

## 2013-12-12 NOTE — ED Notes (Signed)
C/o he woke at 2 am due to HA. Used motrin for HA, then later vomited. His housemates noted he was not up and ready to go to work at the [employer], so they made him get up out of bed. NAD at present

## 2013-12-14 ENCOUNTER — Ambulatory Visit: Payer: Self-pay | Attending: Family Medicine | Admitting: Family Medicine

## 2013-12-14 ENCOUNTER — Encounter: Payer: Self-pay | Admitting: Family Medicine

## 2013-12-14 VITALS — BP 114/76 | HR 82 | Temp 98.2°F | Resp 16 | Wt 145.0 lb

## 2013-12-14 DIAGNOSIS — J45909 Unspecified asthma, uncomplicated: Secondary | ICD-10-CM | POA: Insufficient documentation

## 2013-12-14 DIAGNOSIS — F17220 Nicotine dependence, chewing tobacco, uncomplicated: Secondary | ICD-10-CM | POA: Insufficient documentation

## 2013-12-14 DIAGNOSIS — R208 Other disturbances of skin sensation: Secondary | ICD-10-CM

## 2013-12-14 DIAGNOSIS — J4540 Moderate persistent asthma, uncomplicated: Secondary | ICD-10-CM

## 2013-12-14 DIAGNOSIS — R200 Anesthesia of skin: Secondary | ICD-10-CM

## 2013-12-14 DIAGNOSIS — M79643 Pain in unspecified hand: Secondary | ICD-10-CM

## 2013-12-14 DIAGNOSIS — M79673 Pain in unspecified foot: Secondary | ICD-10-CM | POA: Insufficient documentation

## 2013-12-14 DIAGNOSIS — T7840XA Allergy, unspecified, initial encounter: Secondary | ICD-10-CM | POA: Insufficient documentation

## 2013-12-14 DIAGNOSIS — R1110 Vomiting, unspecified: Secondary | ICD-10-CM | POA: Insufficient documentation

## 2013-12-14 MED ORDER — BECLOMETHASONE DIPROPIONATE 40 MCG/ACT IN AERS: 1.0000 | INHALATION_SPRAY | Freq: Two times a day (BID) | RESPIRATORY_TRACT | Status: DC

## 2013-12-14 MED ORDER — NAPROXEN 500 MG PO TABS: 500.0000 mg | ORAL_TABLET | Freq: Two times a day (BID) | ORAL | Status: DC

## 2013-12-14 MED ORDER — ALBUTEROL SULFATE HFA 108 (90 BASE) MCG/ACT IN AERS: 1.0000 | INHALATION_SPRAY | Freq: Four times a day (QID) | RESPIRATORY_TRACT | Status: AC | PRN

## 2013-12-14 NOTE — Progress Notes (Signed)
Complaining of vomiting and HA x3days Stated is using new product at work , HA and nausea startd after used

## 2013-12-14 NOTE — Assessment & Plan Note (Signed)
A: hand pain resolved. Foot pain likely plantar fascitis P: Ice  NSAID Stretches for plantar fascitis

## 2013-12-14 NOTE — Assessment & Plan Note (Signed)
A: using albuterol 2-3 times daily  P:  Start Qvar BID Albuterol prn

## 2013-12-14 NOTE — Progress Notes (Signed)
   Subjective:    Patient ID: John Allen, male    DOB: 08/28/1985, 28 y.o.   MRN: [id number] CC: f/u urgent care visit for nausea  HPI 28 yo F presents for f/u visit:  1. Asthma: using albuterol 2-3 times daily. Has some chest tightness and SOB. Previously on a controller medicine as a child. Working with Paramedic.   2. Vomiting: associated with headaches started after working a [employer], without weasring a mask. No fever. Symptoms improved with zofran, tylenol, ibuprofen. Patient w/o headache now. Patient last vomited yesterday evening.   3. Hand and foot pain: hand pain has improved. Foot pain persist. Bottom of foot. Pain with lying, sitting with feet elevated.   Soc hx: current tobacco chewer  Review of Systems As per HPI     Objective:   Physical Exam BP 114/76 mmHg  Pulse 82  Temp(Src) 98.2 F (36.8 C) (Oral)  Resp 16  Wt 145 lb (65.772 kg)  SpO2 98% General appearance: alert, cooperative and no distress Lungs: clear to auscultation bilaterally Heart: regular rate and rhythm, S1, S2 normal, no murmur, click, rub or gallop Abdomen: soft, non-tender; bowel sounds normal; no masses,  no organomegaly  Feet: flat, non tender, no lesions. Full ROM.     Assessment & Plan:

## 2013-12-14 NOTE — Patient Instructions (Addendum)
Mr. Cush,  Thank you for following up today.  1. Foot pain: Likely plantar fascitis Plan- naproxen, ice, stretches  Apply for [place] discount and orange card.   2. Asthma:  Albuterol as needed Start controlled medicine, QVAR twice daily  F/u in 4 weeks   Plantar Fasciitis (Heel Spur Syndrome) with Rehab The plantar fascia is a fibrous, ligament-like, soft-tissue structure that spans the bottom of the foot. Plantar fasciitis is a condition that causes pain in the foot due to inflammation of the tissue. SYMPTOMS   Pain and tenderness on the underneath side of the foot.  Pain that worsens with standing or walking. CAUSES  Plantar fasciitis is caused by irritation and injury to the plantar fascia on the underneath side of the foot. Common mechanisms of injury include:  Direct trauma to bottom of the foot.  Damage to a small nerve that runs under the foot where the main fascia attaches to the heel bone.  Stress placed on the plantar fascia due to bone spurs. RISK INCREASES WITH:   Activities that place stress on the plantar fascia (running, jumping, pivoting, or cutting).  Poor strength and flexibility.  Improperly fitted shoes.  Tight calf muscles.  Flat feet.  Failure to warm-up properly before activity.  Obesity. PREVENTION  Warm up and stretch properly before activity.  Allow for adequate recovery between workouts.  Maintain physical fitness:  Strength, flexibility, and endurance.  Cardiovascular fitness.  Maintain a health body weight.  Avoid stress on the plantar fascia.  Wear properly fitted shoes, including arch supports for individuals who have flat feet. PROGNOSIS  If treated properly, then the symptoms of plantar fasciitis usually resolve without surgery. However, occasionally surgery is necessary. RELATED COMPLICATIONS   Recurrent symptoms that may result in a chronic condition.  Problems of the lower back that are caused by  compensating for the injury, such as limping.  Pain or weakness of the foot during push-off following surgery.  Chronic inflammation, scarring, and partial or complete fascia tear, occurring more often from repeated injections. TREATMENT  Treatment initially involves the use of ice and medication to help reduce pain and inflammation. The use of strengthening and stretching exercises may help reduce pain with activity, especially stretches of the Achilles tendon. These exercises may be performed at home or with a therapist. Your caregiver may recommend that you use heel cups of arch supports to help reduce stress on the plantar fascia. Occasionally, corticosteroid injections are given to reduce inflammation. If symptoms persist for greater than 6 months despite non-surgical (conservative), then surgery may be recommended.  MEDICATION   If pain medication is necessary, then nonsteroidal anti-inflammatory medications, such as aspirin and ibuprofen, or other minor pain relievers, such as acetaminophen, are often recommended.  Do not take pain medication within 7 days before surgery.  Prescription pain relievers may be given if deemed necessary by your caregiver. Use only as directed and only as much as you need.  Corticosteroid injections may be given by your caregiver. These injections should be reserved for the most serious cases, because they may only be given a certain number of times. HEAT AND COLD  Cold treatment (icing) relieves pain and reduces inflammation. Cold treatment should be applied for 10 to 15 minutes every 2 to 3 hours for inflammation and pain and immediately after any activity that aggravates your symptoms. Use ice packs or massage the area with a piece of ice (ice massage).  Heat treatment may be used prior to performing  the stretching and strengthening activities prescribed by your caregiver, physical therapist, or athletic trainer. Use a heat pack or soak the injury in warm  water. SEEK IMMEDIATE MEDICAL CARE IF:  Treatment seems to offer no benefit, or the condition worsens.  Any medications produce adverse side effects. EXERCISES RANGE OF MOTION (ROM) AND STRETCHING EXERCISES - Plantar Fasciitis (Heel Spur Syndrome) These exercises may help you when beginning to rehabilitate your injury. Your symptoms may resolve with or without further involvement from your physician, physical therapist or athletic trainer. While completing these exercises, remember:   Restoring tissue flexibility helps normal motion to return to the joints. This allows healthier, less painful movement and activity.  An effective stretch should be held for at least 30 seconds.  A stretch should never be painful. You should only feel a gentle lengthening or release in the stretched tissue. RANGE OF MOTION - Toe Extension, Flexion  Sit with your right / left leg crossed over your opposite knee.  Grasp your toes and gently pull them back toward the top of your foot. You should feel a stretch on the bottom of your toes and/or foot.  Hold this stretch for __________ seconds.  Now, gently pull your toes toward the bottom of your foot. You should feel a stretch on the top of your toes and or foot.  Hold this stretch for __________ seconds. Repeat __________ times. Complete this stretch __________ times per day.  RANGE OF MOTION - Ankle Dorsiflexion, Active Assisted  Remove shoes and sit on a chair that is preferably not on a carpeted surface.  Place right / left foot under knee. Extend your opposite leg for support.  Keeping your heel down, slide your right / left foot back toward the chair until you feel a stretch at your ankle or calf. If you do not feel a stretch, slide your bottom forward to the edge of the chair, while still keeping your heel down.  Hold this stretch for __________ seconds. Repeat __________ times. Complete this stretch __________ times per day.  STRETCH - Gastroc,  Standing  Place hands on wall.  Extend right / left leg, keeping the front knee somewhat bent.  Slightly point your toes inward on your back foot.  Keeping your right / left heel on the floor and your knee straight, shift your weight toward the wall, not allowing your back to arch.  You should feel a gentle stretch in the right / left calf. Hold this position for __________ seconds. Repeat __________ times. Complete this stretch __________ times per day. STRETCH - Soleus, Standing  Place hands on wall.  Extend right / left leg, keeping the other knee somewhat bent.  Slightly point your toes inward on your back foot.  Keep your right / left heel on the floor, bend your back knee, and slightly shift your weight over the back leg so that you feel a gentle stretch deep in your back calf.  Hold this position for __________ seconds. Repeat __________ times. Complete this stretch __________ times per day. STRETCH - Gastrocsoleus, Standing  Note: This exercise can place a lot of stress on your foot and ankle. Please complete this exercise only if specifically instructed by your caregiver.   Place the ball of your right / left foot on a step, keeping your other foot firmly on the same step.  Hold on to the wall or a rail for balance.  Slowly lift your other foot, allowing your body weight to press your heel  down over the edge of the step.  You should feel a stretch in your right / left calf.  Hold this position for __________ seconds.  Repeat this exercise with a slight bend in your right / left knee. Repeat __________ times. Complete this stretch __________ times per day.  STRENGTHENING EXERCISES - Plantar Fasciitis (Heel Spur Syndrome)  These exercises may help you when beginning to rehabilitate your injury. They may resolve your symptoms with or without further involvement from your physician, physical therapist or athletic trainer. While completing these exercises, remember:    Muscles can gain both the endurance and the strength needed for everyday activities through controlled exercises.  Complete these exercises as instructed by your physician, physical therapist or athletic trainer. Progress the resistance and repetitions only as guided. STRENGTH - Towel Curls  Sit in a chair positioned on a non-carpeted surface.  Place your foot on a towel, keeping your heel on the floor.  Pull the towel toward your heel by only curling your toes. Keep your heel on the floor.  If instructed by your physician, physical therapist or athletic trainer, add ____________________ at the end of the towel. Repeat __________ times. Complete this exercise __________ times per day. STRENGTH - Ankle Inversion  Secure one end of a rubber exercise band/tubing to a fixed object (table, pole). Loop the other end around your foot just before your toes.  Place your fists between your knees. This will focus your strengthening at your ankle.  Slowly, pull your big toe up and in, making sure the band/tubing is positioned to resist the entire motion.  Hold this position for __________ seconds.  Have your muscles resist the band/tubing as it slowly pulls your foot back to the starting position. Repeat __________ times. Complete this exercises __________ times per day.  Document Released: 01/18/2005 Document Revised: 04/12/2011 Document Reviewed: 05/02/2008 [facility] Patient Information 2015 [place], [place]. This information is not intended to replace advice given to you by your health care provider. Make sure you discuss any questions you have with your health care provider.

## 2014-02-07 ENCOUNTER — Emergency Department (HOSPITAL_COMMUNITY)
Admission: EM | Admit: 2014-02-07 | Discharge: 2014-02-08 | Disposition: A | Discharge status: Home / Self Care | Payer: No Typology Code available for payment source | Attending: Emergency Medicine | Admitting: Emergency Medicine

## 2014-02-07 ENCOUNTER — Emergency Department (HOSPITAL_COMMUNITY): Payer: No Typology Code available for payment source

## 2014-02-07 ENCOUNTER — Encounter (HOSPITAL_COMMUNITY): Payer: Self-pay | Admitting: Emergency Medicine

## 2014-02-07 DIAGNOSIS — J45909 Unspecified asthma, uncomplicated: Secondary | ICD-10-CM | POA: Insufficient documentation

## 2014-02-07 DIAGNOSIS — S79911A Unspecified injury of right hip, initial encounter: Secondary | ICD-10-CM | POA: Insufficient documentation

## 2014-02-07 DIAGNOSIS — Z720 Tobacco use: Secondary | ICD-10-CM | POA: Diagnosis not present

## 2014-02-07 DIAGNOSIS — Z8589 Personal history of malignant neoplasm of other organs and systems: Secondary | ICD-10-CM | POA: Diagnosis not present

## 2014-02-07 DIAGNOSIS — Z7951 Long term (current) use of inhaled steroids: Secondary | ICD-10-CM | POA: Diagnosis not present

## 2014-02-07 DIAGNOSIS — Y998 Other external cause status: Secondary | ICD-10-CM | POA: Diagnosis not present

## 2014-02-07 DIAGNOSIS — Y9389 Activity, other specified: Secondary | ICD-10-CM | POA: Diagnosis not present

## 2014-02-07 DIAGNOSIS — S01431A Puncture wound without foreign body of right cheek and temporomandibular area, initial encounter: Secondary | ICD-10-CM | POA: Insufficient documentation

## 2014-02-07 DIAGNOSIS — S43401A Unspecified sprain of right shoulder joint, initial encounter: Secondary | ICD-10-CM | POA: Diagnosis not present

## 2014-02-07 DIAGNOSIS — Z880 Allergy status to penicillin: Secondary | ICD-10-CM | POA: Insufficient documentation

## 2014-02-07 DIAGNOSIS — S61411A Laceration without foreign body of right hand, initial encounter: Secondary | ICD-10-CM | POA: Diagnosis not present

## 2014-02-07 DIAGNOSIS — Z791 Long term (current) use of non-steroidal anti-inflammatories (NSAID): Secondary | ICD-10-CM | POA: Diagnosis not present

## 2014-02-07 DIAGNOSIS — Z79899 Other long term (current) drug therapy: Secondary | ICD-10-CM | POA: Insufficient documentation

## 2014-02-07 DIAGNOSIS — S12600A Unspecified displaced fracture of seventh cervical vertebra, initial encounter for closed fracture: Secondary | ICD-10-CM | POA: Insufficient documentation

## 2014-02-07 DIAGNOSIS — S199XXA Unspecified injury of neck, initial encounter: Secondary | ICD-10-CM | POA: Diagnosis present

## 2014-02-07 DIAGNOSIS — S0990XA Unspecified injury of head, initial encounter: Secondary | ICD-10-CM | POA: Diagnosis not present

## 2014-02-07 DIAGNOSIS — S300XXA Contusion of lower back and pelvis, initial encounter: Secondary | ICD-10-CM | POA: Diagnosis not present

## 2014-02-07 DIAGNOSIS — Y92410 Unspecified street and highway as the place of occurrence of the external cause: Secondary | ICD-10-CM | POA: Diagnosis not present

## 2014-02-07 MED ORDER — OXYCODONE-ACETAMINOPHEN 5-325 MG PO TABS: 1.0000 | ORAL_TABLET | Freq: Four times a day (QID) | ORAL | Status: DC | PRN

## 2014-02-07 MED ORDER — MORPHINE SULFATE 4 MG/ML IJ SOLN
4.0000 mg | Freq: Once | INTRAMUSCULAR | Status: AC
  Administered 2014-02-07: 4 mg via INTRAVENOUS
  Filled 2014-02-07: qty 1

## 2014-02-07 MED ORDER — ONDANSETRON HCL 4 MG/2ML IJ SOLN
4.0000 mg | Freq: Once | INTRAMUSCULAR | Status: AC
  Administered 2014-02-08: 4 mg via INTRAVENOUS
  Filled 2014-02-07: qty 2
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
## 2014-02-08 MED ORDER — OXYCODONE-ACETAMINOPHEN 5-325 MG PO TABS
1.0000 | ORAL_TABLET | Freq: Four times a day (QID) | ORAL | Status: DC | PRN
Start: 2014-02-08 — End: 2014-02-26

## 2014-02-08 MED ORDER — ALBUTEROL SULFATE HFA 108 (90 BASE) MCG/ACT IN AERS
2.0000 | INHALATION_SPRAY | Freq: Once | RESPIRATORY_TRACT | Status: DC
  Filled 2014-02-08: qty 6.7

## 2014-02-26 ENCOUNTER — Emergency Department (HOSPITAL_COMMUNITY)
Admission: EM | Admit: 2014-02-26 | Discharge: 2014-02-26 | Disposition: A | Discharge status: Home / Self Care | Payer: No Typology Code available for payment source | Attending: Emergency Medicine | Admitting: Emergency Medicine

## 2014-02-26 ENCOUNTER — Encounter (HOSPITAL_COMMUNITY): Payer: Self-pay | Admitting: Emergency Medicine

## 2014-02-26 DIAGNOSIS — Z8589 Personal history of malignant neoplasm of other organs and systems: Secondary | ICD-10-CM | POA: Diagnosis not present

## 2014-02-26 DIAGNOSIS — S12600D Unspecified displaced fracture of seventh cervical vertebra, subsequent encounter for fracture with routine healing: Secondary | ICD-10-CM

## 2014-02-26 DIAGNOSIS — R200 Anesthesia of skin: Secondary | ICD-10-CM

## 2014-02-26 DIAGNOSIS — Z7951 Long term (current) use of inhaled steroids: Secondary | ICD-10-CM | POA: Insufficient documentation

## 2014-02-26 DIAGNOSIS — G8911 Acute pain due to trauma: Secondary | ICD-10-CM | POA: Diagnosis not present

## 2014-02-26 DIAGNOSIS — Z79899 Other long term (current) drug therapy: Secondary | ICD-10-CM | POA: Diagnosis not present

## 2014-02-26 DIAGNOSIS — J45909 Unspecified asthma, uncomplicated: Secondary | ICD-10-CM | POA: Insufficient documentation

## 2014-02-26 DIAGNOSIS — Z880 Allergy status to penicillin: Secondary | ICD-10-CM | POA: Diagnosis not present

## 2014-02-26 DIAGNOSIS — Z720 Tobacco use: Secondary | ICD-10-CM | POA: Diagnosis not present

## 2014-02-26 DIAGNOSIS — S199XXD Unspecified injury of neck, subsequent encounter: Secondary | ICD-10-CM | POA: Diagnosis present

## 2014-02-26 DIAGNOSIS — Z791 Long term (current) use of non-steroidal anti-inflammatories (NSAID): Secondary | ICD-10-CM | POA: Insufficient documentation

## 2014-02-26 MED ORDER — OXYCODONE-ACETAMINOPHEN 5-325 MG PO TABS
2.0000 | ORAL_TABLET | Freq: Once | ORAL | Status: AC
  Administered 2014-02-26: 2 via ORAL
  Filled 2014-02-26: qty 2

## 2014-02-26 MED ORDER — ONDANSETRON 4 MG PO TBDP
4.0000 mg | ORAL_TABLET | Freq: Once | ORAL | Status: AC
  Administered 2014-02-26: 4 mg via ORAL
  Filled 2014-02-26: qty 1

## 2014-02-26 MED ORDER — OXYCODONE-ACETAMINOPHEN 10-325 MG PO TABS
0.5000 | ORAL_TABLET | ORAL | Status: DC | PRN
Start: 2014-02-26 — End: 2019-08-22

## 2014-02-26 MED ORDER — NAPROXEN 500 MG PO TABS: 500.0000 mg | ORAL_TABLET | Freq: Two times a day (BID) | ORAL | Status: AC
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
## 2014-02-26 NOTE — ED Notes (Signed)
Pt reports increased neck pain after completing all pain meds. C-collar in use. Schedule to seen neurosurgeon in 12 weeks. VS stable
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
## 2014-02-28 NOTE — Progress Notes (Signed)
02/28/2014 A. Keveon Amsler RNCM 1838pm EDCM spoke to patient's mother Silva Bandy to inform her that patient's clinical information had been faxed.  Patient's mother reports no one has contacted them regarding an appointment yet.  EDCM encouraged patient's mother to call Dr. Donnella Bi office for appointment as well.

## 2014-03-01 NOTE — Progress Notes (Signed)
03/01/2014 A. Trevontae Lindahl RNCM 1555pm EDCM called and spoke to John Allen at Dr. Donnella Bi office who reports patient's appointment is scheduled for Feb 12 at [place] called and spoke to patient who confirmed Dr. Donnella Bi office had called him with the appointment.  No further EDCM needs at this time.

## 2014-05-10 ENCOUNTER — Other Ambulatory Visit: Payer: Self-pay | Admitting: Neurosurgery

## 2014-05-10 DIAGNOSIS — S12691A Other nondisplaced fracture of seventh cervical vertebra, initial encounter for closed fracture: Secondary | ICD-10-CM

## 2014-05-13 ENCOUNTER — Ambulatory Visit
Admission: RE | Admit: 2014-05-13 | Discharge: 2014-05-13 | Disposition: A | Discharge status: Home / Self Care | Payer: No Typology Code available for payment source | Source: Ambulatory Visit | Attending: Neurosurgery | Admitting: Neurosurgery

## 2014-05-13 DIAGNOSIS — S12691A Other nondisplaced fracture of seventh cervical vertebra, initial encounter for closed fracture: Secondary | ICD-10-CM

## 2015-09-14 IMAGING — CR DG CHEST 2V
2 series · 2 of 2 positions shown · non-contrast
Comparison: None.

CLINICAL DATA: Pt has asthma and had an asthma attack at work today
but was unable to get his inhaler for about 4-5 hrs later. SOB.
Chest soreness. Non-smoker.

EXAM:
CHEST  2 VIEW

[w chest pa]
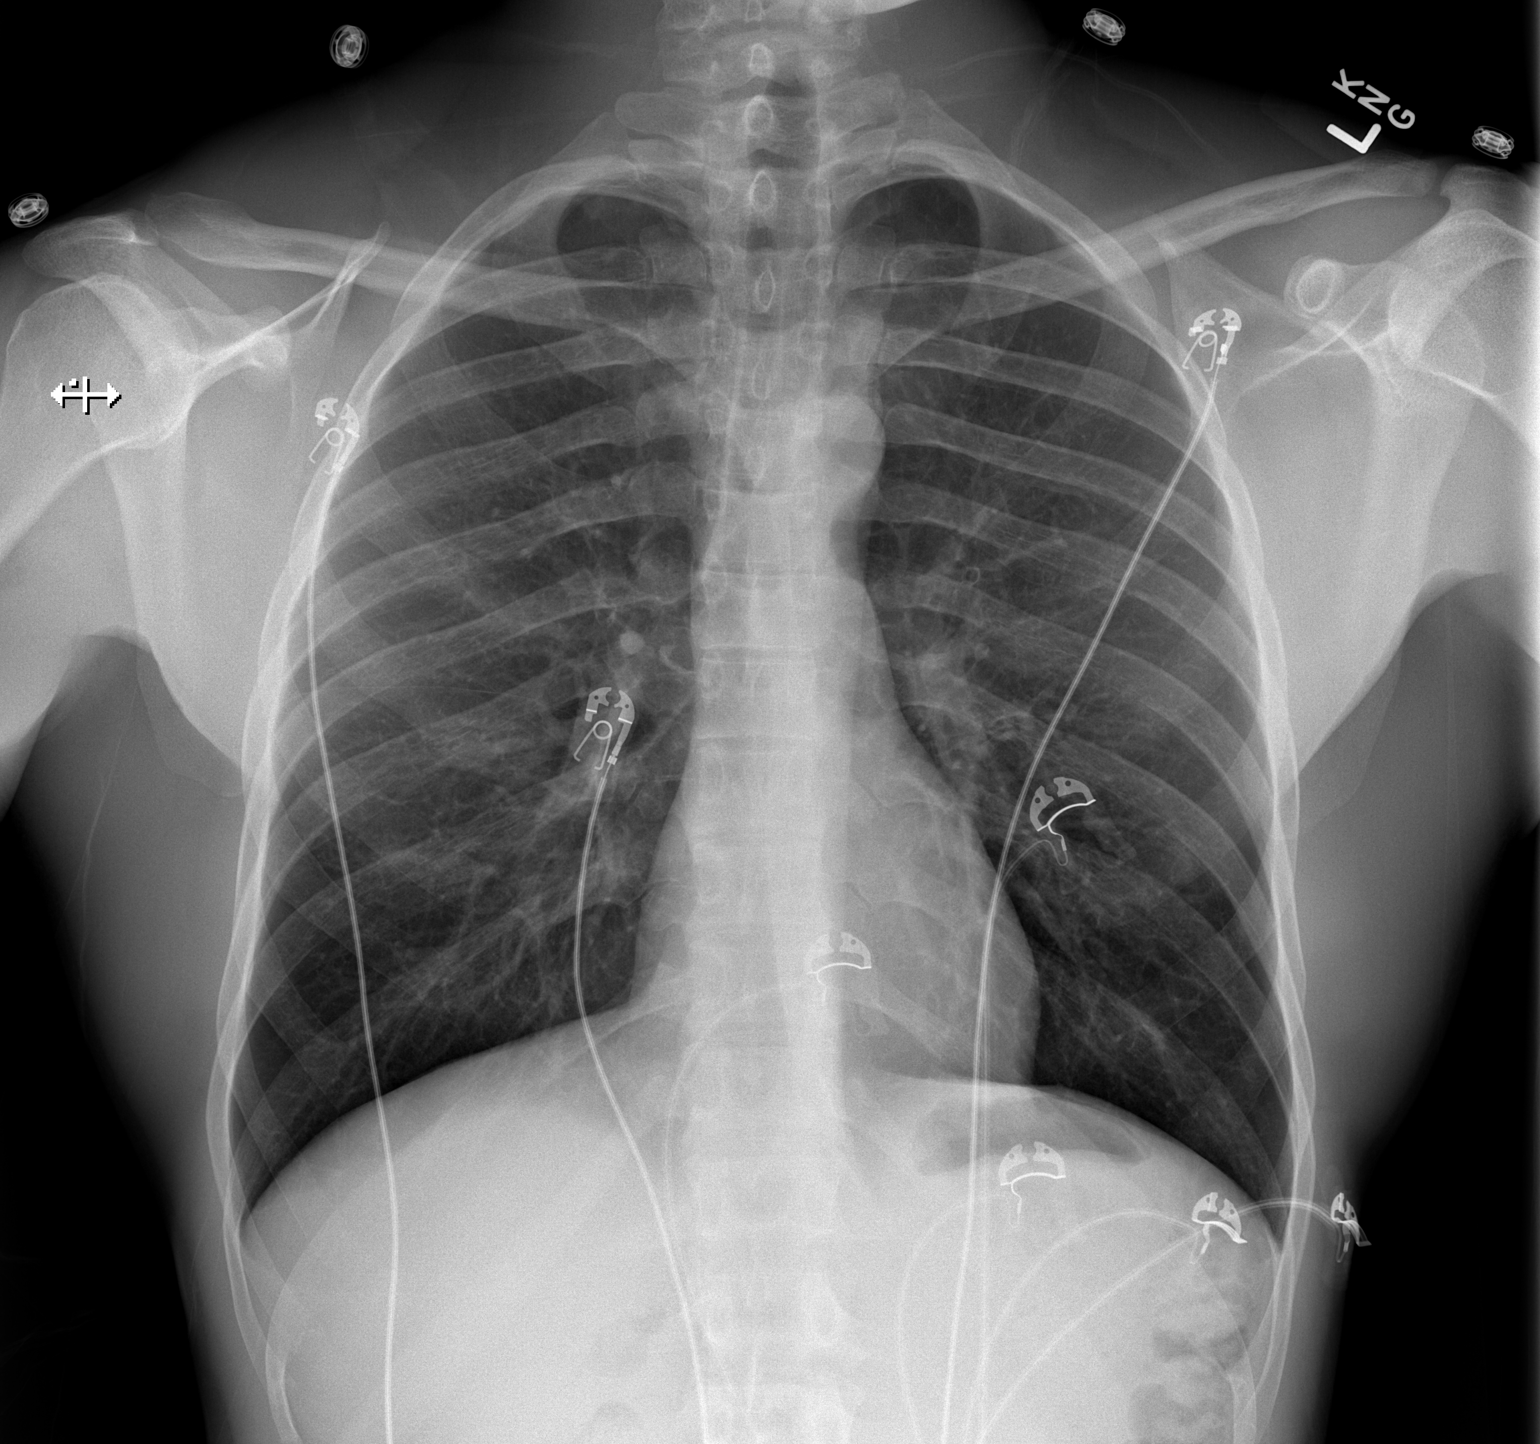

[w chest lat]
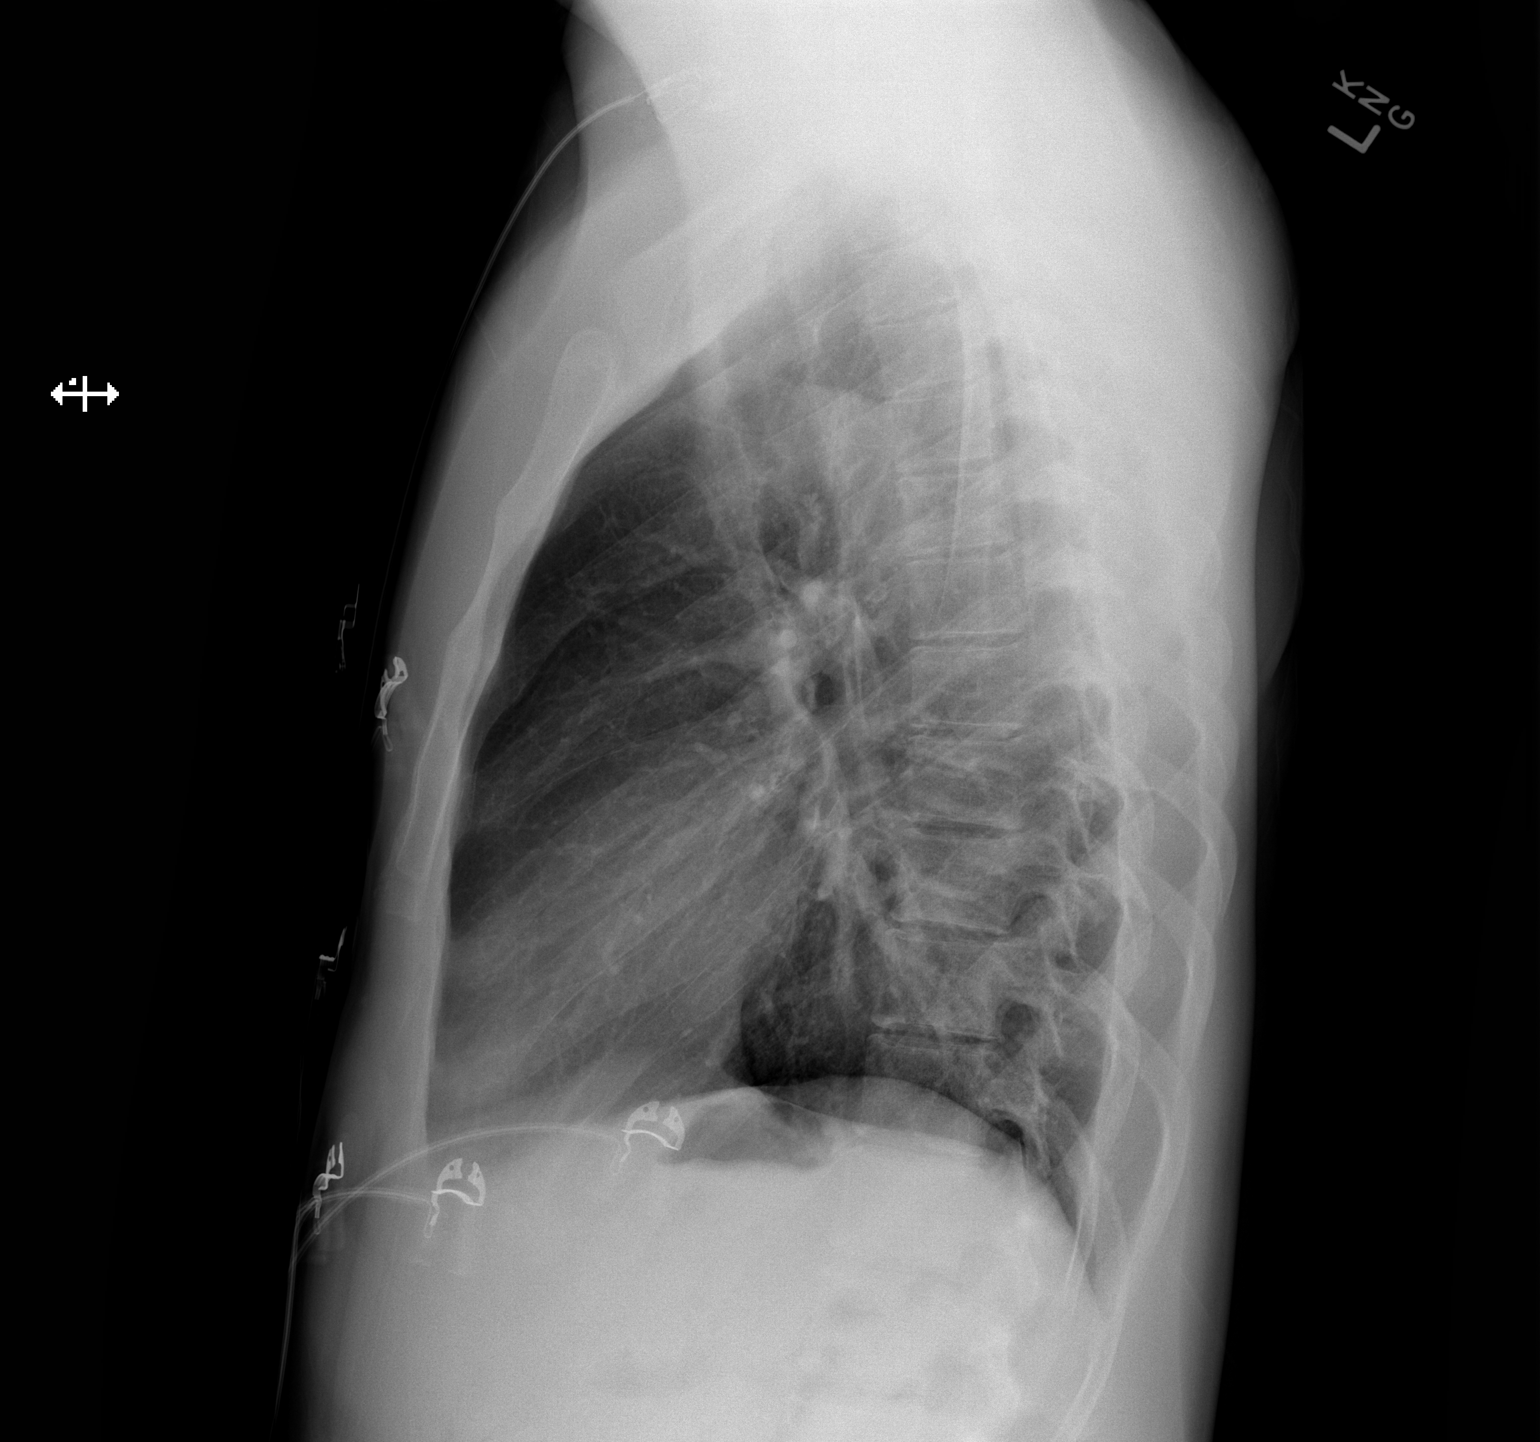

[2 of 2 positions shown; findings below may reference images not displayed]

FINDINGS: The heart size and mediastinal contours are within normal limits.
Both lungs are clear. No pleural effusion or pneumothorax. The
visualized skeletal structures are unremarkable.
IMPRESSION: No active cardiopulmonary disease.
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
## 2015-12-24 ENCOUNTER — Emergency Department: Payer: No Typology Code available for payment source

## 2015-12-24 ENCOUNTER — Encounter: Payer: Self-pay | Admitting: Emergency Medicine

## 2015-12-24 ENCOUNTER — Emergency Department
Admission: EM | Admit: 2015-12-24 | Discharge: 2015-12-24 | Disposition: A | Discharge status: Home / Self Care | Payer: No Typology Code available for payment source | Attending: Emergency Medicine | Admitting: Emergency Medicine

## 2015-12-24 DIAGNOSIS — S161XXA Strain of muscle, fascia and tendon at neck level, initial encounter: Secondary | ICD-10-CM

## 2015-12-24 DIAGNOSIS — Y999 Unspecified external cause status: Secondary | ICD-10-CM | POA: Insufficient documentation

## 2015-12-24 DIAGNOSIS — J45909 Unspecified asthma, uncomplicated: Secondary | ICD-10-CM | POA: Insufficient documentation

## 2015-12-24 DIAGNOSIS — F17210 Nicotine dependence, cigarettes, uncomplicated: Secondary | ICD-10-CM | POA: Diagnosis not present

## 2015-12-24 DIAGNOSIS — Z85028 Personal history of other malignant neoplasm of stomach: Secondary | ICD-10-CM | POA: Diagnosis not present

## 2015-12-24 DIAGNOSIS — Y9389 Activity, other specified: Secondary | ICD-10-CM | POA: Insufficient documentation

## 2015-12-24 DIAGNOSIS — Y92410 Unspecified street and highway as the place of occurrence of the external cause: Secondary | ICD-10-CM | POA: Insufficient documentation

## 2015-12-24 DIAGNOSIS — S199XXA Unspecified injury of neck, initial encounter: Secondary | ICD-10-CM | POA: Diagnosis present

## 2015-12-24 MED ORDER — IBUPROFEN 800 MG PO TABS
800.0000 mg | ORAL_TABLET | Freq: Once | ORAL | Status: AC
  Administered 2015-12-24: 800 mg via ORAL
  Filled 2015-12-24: qty 1

## 2015-12-24 MED ORDER — CYCLOBENZAPRINE HCL 5 MG PO TABS: 5.0000 mg | ORAL_TABLET | Freq: Three times a day (TID) | ORAL | 0 refills | Status: DC | PRN

## 2015-12-24 MED ORDER — DIAZEPAM 5 MG PO TABS
5.0000 mg | ORAL_TABLET | Freq: Once | ORAL | Status: DC
  Filled 2015-12-24: qty 1

## 2015-12-24 MED ORDER — IBUPROFEN 800 MG PO TABS: 800.0000 mg | ORAL_TABLET | Freq: Three times a day (TID) | ORAL | 0 refills | Status: AC | PRN
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
## 2015-12-24 NOTE — ED Notes (Signed)
Pt verbalized understanding of discharge instructions. NAD at this time. 

## 2015-12-24 NOTE — Discharge Instructions (Signed)
Please take medications as prescribed return to the ER for any worsening symptoms or urgent changes in her health. Follow-up with your primary care physician or Dr. Mack Guise in 5-7 days if no improvement.

## 2016-06-16 ENCOUNTER — Encounter: Payer: Self-pay | Admitting: Family Medicine

## 2019-08-22 ENCOUNTER — Encounter: Payer: Self-pay | Admitting: Emergency Medicine

## 2019-08-22 ENCOUNTER — Other Ambulatory Visit: Payer: Self-pay

## 2019-08-22 ENCOUNTER — Emergency Department (INDEPENDENT_AMBULATORY_CARE_PROVIDER_SITE_OTHER)
Admission: EM | Admit: 2019-08-22 | Discharge: 2019-08-22 | Disposition: A | Discharge status: Home / Self Care | Payer: Self-pay | Source: Home / Self Care | Attending: Family Medicine | Admitting: Family Medicine

## 2019-08-22 DIAGNOSIS — Z202 Contact with and (suspected) exposure to infections with a predominantly sexual mode of transmission: Secondary | ICD-10-CM

## 2019-08-22 DIAGNOSIS — K0500 Acute gingivitis, plaque induced: Secondary | ICD-10-CM

## 2019-08-22 DIAGNOSIS — R110 Nausea: Secondary | ICD-10-CM

## 2019-08-22 MED ORDER — ONDANSETRON 4 MG PO TBDP: 4.0000 mg | ORAL_TABLET | Freq: Three times a day (TID) | ORAL | 0 refills | Status: AC | PRN

## 2019-08-22 MED ORDER — AZITHROMYCIN 500 MG PO TABS: ORAL_TABLET | ORAL | 0 refills | Status: AC

## 2019-08-22 NOTE — ED Triage Notes (Addendum)
Exposure to Chlamydia, asymptomatic. Vomiting x 2days Bleeding gums

## 2019-08-22 NOTE — ED Provider Notes (Signed)
[name]    CSN: [id number] Arrival date & time: 08/22/19  1446      History   Chief Complaint Chief Complaint  Patient presents with  . Exposure to STD    HPI John Allen is a 34 y.o. male.   Patient reports that he has had known exposure to chlamydia, but denies urethral discharge, testicular pain/swelling, and dysuria.  He complains of onset of nausea without vomiting and loose stools for two days.  His loose stools are now almost resolved.  He denies fevers, chills, and sweats and abdominal pain.  He also complains of bleeding gums for two days without pain or toothache.  He denies recent foreign travel, or drinking untreated water in a wilderness environment.  He denies recent antibiotic use.   The history is provided by the patient.    Past Medical History:  Diagnosis Date  . Allergy 1996   . Asthma    since chidhood, has been hospitalized, never intubated   . 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 Asthma   . Allergy   . Hand and foot pain 10/23/2013    Past Surgical History:  Procedure Laterality Date  . 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 CYSTECTOMY  2004    Right Hip       Home Medications    Prior to Admission medications   Medication Sig Start Date End Date Taking? Authorizing Provider  albuterol (PROVENTIL HFA;VENTOLIN HFA) 108 (90 BASE) MCG/ACT inhaler Inhale 1-2 puffs into the lungs every 6 (six) hours as needed for wheezing or shortness of breath. 12/14/13   Boykin Nearing, MD  azithromycin (ZITHROMAX) 500 MG tablet Take two tabs by mouth as a single dose 08/22/19   Kandra Nicolas, MD  ibuprofen (ADVIL,MOTRIN) 800 MG tablet Take 1 tablet (800 mg total) by mouth every 8 (eight) hours as needed. 12/24/15   Duanne Guess, PA-C  naproxen (NAPROSYN) 500 MG tablet Take 1 tablet (500 mg total) by mouth 2 (two) times daily  with a meal. 02/26/14   Harris, Abigail, PA-C  ondansetron (ZOFRAN ODT) 4 MG disintegrating tablet Take 1 tablet (4 mg total) by mouth every 8 (eight) hours as needed for nausea or vomiting. Dissolve under tongue 08/22/19   Kandra Nicolas, MD  ondansetron (ZOFRAN) 4 MG tablet Take 1 tablet (4 mg total) by mouth every 8 (eight) hours as needed for nausea or vomiting. 12/12/13   Presson, Audelia Hives, PA    Family History Family History  Problem Relation Age of Onset  . Hypertension Mother   . Asthma Mother   . Cancer Maternal Aunt        breast cancer   . Heart disease Maternal Aunt   . Cancer Maternal Uncle        lung   . Heart disease Maternal Uncle     Social History Social History   Tobacco Use  . Smoking status: Light Tobacco Smoker    Types: Cigarettes  . Smokeless tobacco: Current User    Types: Chew  Vaping Use  . Vaping Use: Never used  Substance Use Topics  . Alcohol use: Yes  . Drug use: No     Allergies   Penicillins and Strawberry extract   Review of Systems Review of Systems  Constitutional: Positive for activity  change, appetite change and fatigue. Negative for chills, diaphoresis, fever and unexpected weight change.  HENT: Negative for mouth sores and trouble swallowing.        Bleeding gums  Eyes: Negative.   Respiratory: Negative.   Cardiovascular: Negative.   Gastrointestinal: Positive for diarrhea and nausea. Negative for abdominal distention, abdominal pain, blood in stool and vomiting.  Genitourinary: Negative for discharge, frequency, genital sores, hematuria, testicular pain and urgency.  Musculoskeletal: Negative.   Skin: Negative.   Neurological: Negative.   Hematological: Negative.      Physical Exam Triage Vital Signs ED Triage Vitals  Enc Vitals Group     BP 08/22/19 1501 112/75     Pulse Rate 08/22/19 1501 100     Resp --      Temp 08/22/19 1501 98.7 F (37.1 C)     Temp Source 08/22/19 1501 Oral     SpO2 08/22/19 1501  97 %     Weight 08/22/19 1502 138 lb (62.6 kg)     Height 08/22/19 1502 5\' 7"  (1.702 m)     Head Circumference --      Peak Flow --      Pain Score 08/22/19 1502 0     Pain Loc --      Pain Edu? --      Excl. in [place]? --    No data found.  Updated Vital Signs BP 112/75 (BP Location: Right Arm)   Pulse 100   Temp 98.7 F (37.1 C) (Oral)   Ht 5\' 7"  (1.702 m)   Wt 62.6 kg   SpO2 97%   BMI 21.61 kg/m   Visual Acuity Right Eye Distance:   Left Eye Distance:   Bilateral Distance:    Right Eye Near:   Left Eye Near:    Bilateral Near:     Physical Exam Vitals and nursing note reviewed.  Constitutional:      General: He is not in acute distress.    Appearance: He is not ill-appearing.  HENT:     Head: Normocephalic.     Right Ear: Tympanic membrane normal.     Left Ear: Tympanic membrane normal.     Nose: Nose normal.     Mouth/Throat:     Mouth: Mucous membranes are moist.     Dentition: Abnormal dentition. Gingival swelling present.     Comments: Mild gingival swelling present with minimal tenderness. Eyes:     Conjunctiva/sclera: Conjunctivae normal.     Pupils: Pupils are equal, round, and reactive to light.  Cardiovascular:     Rate and Rhythm: Tachycardia present.     Heart sounds: Normal heart sounds.  Pulmonary:     Breath sounds: Normal breath sounds.  Abdominal:     General: Abdomen is flat. Bowel sounds are normal. There is no distension.     Palpations: Abdomen is soft.     Tenderness: There is no abdominal tenderness.  Lymphadenopathy:     Cervical: No cervical adenopathy.  Skin:    General: Skin is warm and dry.  Neurological:     Mental Status: He is alert and oriented to person, place, and time.      UC Treatments / Results  Labs (all labs ordered are listed, but only abnormal results are displayed) Labs Reviewed - No data to display  EKG   Radiology No results found.  Procedures Procedures (including critical care  time)  Medications Ordered in UC Medications - No data to display  Initial  Impression / Assessment and Plan / UC Course  I have reviewed the triage vital signs and the nursing notes.  Pertinent labs & imaging results that were available during my care of the patient were reviewed by me and considered in my medical decision making (see chart for details).    Suspect viral gastroenteritis. Patient denies urinary symptoms.  Will begin empiric azithromycin to cover possible chlamydia.  Rx for Zofran ODT 4mg . Recommend dentist followup. Followup with Family Doctor if not improved in about                                                                                                                               Final Clinical Impressions(s) / UC Diagnoses   Final diagnoses:  STD exposure  Acute gingivitis  Nausea without vomiting     Discharge Instructions     Begin clear liquids for about 12 hours, then may begin a BRAT diet (Bananas, Rice, Applesauce, Toast) when nausea  improved. Then gradually advance to a regular diet as tolerated.  Avoid milk products until well.   Continue to use an antibacterial mouthwash daily.  Brush teeth daily. If symptoms become significantly worse during the night or over the weekend, proceed to the local emergency room.    ED Prescriptions    Medication Sig Dispense Auth. Provider   ondansetron (ZOFRAN ODT) 4 MG disintegrating tablet Take 1 tablet (4 mg total) by mouth every 8 (eight) hours as needed for nausea or vomiting. Dissolve under tongue 12 tablet Kandra Nicolas, MD   azithromycin (ZITHROMAX) 500 MG tablet Take two tabs by mouth as a single dose 2 tablet Kandra Nicolas, MD        Kandra Nicolas, MD 08/23/19 [phone]

## 2019-08-22 NOTE — Discharge Instructions (Signed)
Begin clear liquids for about 12 hours, then may begin a BRAT diet (Bananas, Rice, Applesauce, Toast) when nausea  improved. Then gradually advance to a regular diet as tolerated.  Avoid milk products until well.   Continue to use an antibacterial mouthwash daily.  Brush teeth daily. If symptoms become significantly worse during the night or over the weekend, proceed to the local emergency room.
